# Patient Record
Sex: Female | Born: 1935 | Race: White | Hispanic: No | Smoking: Never smoker
Health system: Southern US, Community
[De-identification: ages and names within clinical notes are randomized; demographics above are authoritative.]

## PROBLEM LIST (undated history)

## (undated) DIAGNOSIS — F0280 Dementia in other diseases classified elsewhere without behavioral disturbance: Secondary | ICD-10-CM

## (undated) DIAGNOSIS — G309 Alzheimer's disease, unspecified: Secondary | ICD-10-CM

## (undated) DIAGNOSIS — F32A Depression, unspecified: Secondary | ICD-10-CM

## (undated) DIAGNOSIS — K219 Gastro-esophageal reflux disease without esophagitis: Secondary | ICD-10-CM

## (undated) DIAGNOSIS — I10 Essential (primary) hypertension: Secondary | ICD-10-CM

## (undated) DIAGNOSIS — Z515 Encounter for palliative care: Secondary | ICD-10-CM

## (undated) DIAGNOSIS — F329 Major depressive disorder, single episode, unspecified: Secondary | ICD-10-CM

## (undated) HISTORY — PX: BACK SURGERY: SHX140

---

## 1997-07-07 ENCOUNTER — Other Ambulatory Visit: Admission: RE | Admit: 1997-07-07 | Discharge: 1997-07-07 | Payer: Self-pay | Admitting: Obstetrics and Gynecology

## 1997-11-08 ENCOUNTER — Other Ambulatory Visit: Admission: RE | Admit: 1997-11-08 | Discharge: 1997-11-08 | Payer: Self-pay | Admitting: Obstetrics and Gynecology

## 1998-03-15 ENCOUNTER — Other Ambulatory Visit: Admission: RE | Admit: 1998-03-15 | Discharge: 1998-03-15 | Payer: Self-pay | Admitting: Obstetrics and Gynecology

## 1998-08-05 ENCOUNTER — Inpatient Hospital Stay (HOSPITAL_COMMUNITY): Admission: EM | Admit: 1998-08-05 | Discharge: 1998-08-11 | Payer: Self-pay | Admitting: Emergency Medicine

## 1998-08-05 ENCOUNTER — Encounter: Payer: Self-pay | Admitting: Gastroenterology

## 1998-08-05 ENCOUNTER — Encounter: Payer: Self-pay | Admitting: Internal Medicine

## 1998-08-06 ENCOUNTER — Encounter: Payer: Self-pay | Admitting: Gastroenterology

## 1998-08-07 ENCOUNTER — Encounter: Payer: Self-pay | Admitting: Gastroenterology

## 1998-08-08 ENCOUNTER — Encounter: Payer: Self-pay | Admitting: Gastroenterology

## 1998-08-09 ENCOUNTER — Encounter: Payer: Self-pay | Admitting: Gastroenterology

## 1998-10-02 ENCOUNTER — Other Ambulatory Visit: Admission: RE | Admit: 1998-10-02 | Discharge: 1998-10-02 | Payer: Self-pay | Admitting: Obstetrics and Gynecology

## 1999-02-26 ENCOUNTER — Other Ambulatory Visit: Admission: RE | Admit: 1999-02-26 | Discharge: 1999-02-26 | Payer: Self-pay | Admitting: Obstetrics and Gynecology

## 1999-08-26 ENCOUNTER — Other Ambulatory Visit: Admission: RE | Admit: 1999-08-26 | Discharge: 1999-08-26 | Payer: Self-pay | Admitting: Obstetrics and Gynecology

## 2000-02-04 ENCOUNTER — Other Ambulatory Visit: Admission: RE | Admit: 2000-02-04 | Discharge: 2000-02-04 | Payer: Self-pay | Admitting: Obstetrics and Gynecology

## 2000-03-06 ENCOUNTER — Encounter (INDEPENDENT_AMBULATORY_CARE_PROVIDER_SITE_OTHER): Payer: Self-pay | Admitting: Specialist

## 2000-03-06 ENCOUNTER — Ambulatory Visit (HOSPITAL_COMMUNITY): Admission: RE | Admit: 2000-03-06 | Discharge: 2000-03-06 | Payer: Self-pay | Admitting: Gastroenterology

## 2000-07-29 ENCOUNTER — Other Ambulatory Visit: Admission: RE | Admit: 2000-07-29 | Discharge: 2000-07-29 | Payer: Self-pay | Admitting: Obstetrics and Gynecology

## 2001-03-29 ENCOUNTER — Other Ambulatory Visit: Admission: RE | Admit: 2001-03-29 | Discharge: 2001-03-29 | Payer: Self-pay | Admitting: Obstetrics and Gynecology

## 2001-11-05 ENCOUNTER — Emergency Department (HOSPITAL_COMMUNITY): Admission: EM | Admit: 2001-11-05 | Discharge: 2001-11-05 | Payer: Self-pay | Admitting: Emergency Medicine

## 2001-11-05 ENCOUNTER — Encounter: Payer: Self-pay | Admitting: Emergency Medicine

## 2002-03-31 ENCOUNTER — Other Ambulatory Visit: Admission: RE | Admit: 2002-03-31 | Discharge: 2002-03-31 | Payer: Self-pay | Admitting: Obstetrics and Gynecology

## 2002-07-13 ENCOUNTER — Encounter: Payer: Self-pay | Admitting: Internal Medicine

## 2002-07-13 ENCOUNTER — Encounter: Admission: RE | Admit: 2002-07-13 | Discharge: 2002-07-13 | Payer: Self-pay | Admitting: Internal Medicine

## 2002-09-29 ENCOUNTER — Other Ambulatory Visit: Admission: RE | Admit: 2002-09-29 | Discharge: 2002-09-29 | Payer: Self-pay | Admitting: Obstetrics and Gynecology

## 2002-10-25 ENCOUNTER — Encounter: Payer: Self-pay | Admitting: Gastroenterology

## 2002-10-25 ENCOUNTER — Encounter: Admission: RE | Admit: 2002-10-25 | Discharge: 2002-10-25 | Payer: Self-pay | Admitting: Gastroenterology

## 2003-06-29 ENCOUNTER — Other Ambulatory Visit: Admission: RE | Admit: 2003-06-29 | Discharge: 2003-06-29 | Payer: Self-pay | Admitting: Obstetrics and Gynecology

## 2004-03-19 ENCOUNTER — Ambulatory Visit (HOSPITAL_COMMUNITY): Admission: RE | Admit: 2004-03-19 | Discharge: 2004-03-19 | Payer: Self-pay | Admitting: Ophthalmology

## 2004-03-29 ENCOUNTER — Ambulatory Visit (HOSPITAL_COMMUNITY): Admission: RE | Admit: 2004-03-29 | Discharge: 2004-03-29 | Payer: Self-pay | Admitting: Ophthalmology

## 2004-11-26 ENCOUNTER — Other Ambulatory Visit: Admission: RE | Admit: 2004-11-26 | Discharge: 2004-11-26 | Payer: Self-pay | Admitting: Obstetrics and Gynecology

## 2005-02-21 ENCOUNTER — Encounter: Admission: RE | Admit: 2005-02-21 | Discharge: 2005-02-21 | Payer: Self-pay | Admitting: Endocrinology

## 2005-06-05 ENCOUNTER — Emergency Department (HOSPITAL_COMMUNITY): Admission: EM | Admit: 2005-06-05 | Discharge: 2005-06-05 | Payer: Self-pay | Admitting: Emergency Medicine

## 2006-03-03 ENCOUNTER — Other Ambulatory Visit: Admission: RE | Admit: 2006-03-03 | Discharge: 2006-03-03 | Payer: Self-pay | Admitting: Obstetrics and Gynecology

## 2006-12-25 ENCOUNTER — Encounter: Admission: RE | Admit: 2006-12-25 | Discharge: 2006-12-25 | Payer: Self-pay | Admitting: Internal Medicine

## 2007-03-11 ENCOUNTER — Other Ambulatory Visit: Admission: RE | Admit: 2007-03-11 | Discharge: 2007-03-11 | Payer: Self-pay | Admitting: Obstetrics and Gynecology

## 2007-03-17 ENCOUNTER — Observation Stay (HOSPITAL_COMMUNITY): Admission: EM | Admit: 2007-03-17 | Discharge: 2007-03-18 | Payer: Self-pay | Admitting: Emergency Medicine

## 2007-04-01 ENCOUNTER — Encounter: Admission: RE | Admit: 2007-04-01 | Discharge: 2007-04-01 | Payer: Self-pay | Admitting: Neurology

## 2008-02-23 ENCOUNTER — Emergency Department (HOSPITAL_COMMUNITY): Admission: EM | Admit: 2008-02-23 | Discharge: 2008-02-23 | Payer: Self-pay | Admitting: Emergency Medicine

## 2008-04-20 ENCOUNTER — Emergency Department: Admission: EM | Admit: 2008-04-20 | Discharge: 2008-04-20 | Payer: Self-pay | Admitting: Emergency Medicine

## 2008-11-14 ENCOUNTER — Emergency Department (HOSPITAL_COMMUNITY): Admission: EM | Admit: 2008-11-14 | Discharge: 2008-11-14 | Payer: Self-pay | Admitting: Emergency Medicine

## 2009-06-11 ENCOUNTER — Emergency Department (HOSPITAL_COMMUNITY): Admission: EM | Admit: 2009-06-11 | Discharge: 2009-06-11 | Payer: Self-pay | Admitting: Emergency Medicine

## 2009-11-06 ENCOUNTER — Emergency Department (HOSPITAL_COMMUNITY): Admission: EM | Admit: 2009-11-06 | Discharge: 2009-11-06 | Payer: Self-pay | Admitting: Emergency Medicine

## 2009-12-30 ENCOUNTER — Emergency Department (HOSPITAL_COMMUNITY): Admission: EM | Admit: 2009-12-30 | Discharge: 2009-12-30 | Payer: Self-pay | Admitting: Emergency Medicine

## 2010-02-05 ENCOUNTER — Encounter: Payer: Self-pay | Admitting: Internal Medicine

## 2010-02-05 ENCOUNTER — Encounter
Admission: RE | Admit: 2010-02-05 | Discharge: 2010-02-05 | Payer: Self-pay | Source: Home / Self Care | Attending: Geriatric Medicine | Admitting: Geriatric Medicine

## 2010-02-11 ENCOUNTER — Ambulatory Visit: Payer: Self-pay | Admitting: Internal Medicine

## 2010-03-12 DIAGNOSIS — F331 Major depressive disorder, recurrent, moderate: Secondary | ICD-10-CM | POA: Insufficient documentation

## 2010-03-12 DIAGNOSIS — F0280 Dementia in other diseases classified elsewhere without behavioral disturbance: Secondary | ICD-10-CM | POA: Insufficient documentation

## 2010-03-12 DIAGNOSIS — I10 Essential (primary) hypertension: Secondary | ICD-10-CM | POA: Insufficient documentation

## 2010-03-12 DIAGNOSIS — K219 Gastro-esophageal reflux disease without esophagitis: Secondary | ICD-10-CM | POA: Insufficient documentation

## 2010-03-12 DIAGNOSIS — G309 Alzheimer's disease, unspecified: Secondary | ICD-10-CM

## 2010-03-12 DIAGNOSIS — M810 Age-related osteoporosis without current pathological fracture: Secondary | ICD-10-CM | POA: Insufficient documentation

## 2010-03-13 ENCOUNTER — Telehealth (INDEPENDENT_AMBULATORY_CARE_PROVIDER_SITE_OTHER): Payer: Self-pay | Admitting: *Deleted

## 2010-03-13 DIAGNOSIS — J4489 Other specified chronic obstructive pulmonary disease: Secondary | ICD-10-CM | POA: Insufficient documentation

## 2010-03-13 DIAGNOSIS — J189 Pneumonia, unspecified organism: Secondary | ICD-10-CM | POA: Insufficient documentation

## 2010-03-13 DIAGNOSIS — J449 Chronic obstructive pulmonary disease, unspecified: Secondary | ICD-10-CM | POA: Insufficient documentation

## 2010-03-14 ENCOUNTER — Encounter: Payer: Self-pay | Admitting: Internal Medicine

## 2010-03-28 NOTE — Miscellaneous (Signed)
Summary: MD Signature Request / Barrett Henle  MD Signature Request / [facility]   Imported By: Lennie Odor 03/22/2010 09:18:36  _____________________________________________________________________  External Attachment:    Type:   Image     Comment:   External Document

## 2010-03-28 NOTE — Assessment & Plan Note (Signed)
Summary: Pulmonary/ new pt eval   Visit Type:  Initial Consult Copy to:  Marletta Lor, NP  Primary Provider/Referring Provider:  Dr. Redmond School  CC:  Abnormal CXR.  History of Present Illness: 45 yowf quit smoking in the 1970s with no resp issues residing in SNF due to dementia.   March 13, 2010  1st pulmonary office eval for cc new onset persistent chest congestion x 2 months s/p 2 courses abx, comes with daughters who feel she has recovered ok and no apparent difficulty with swallowing or sinuses. no noct complaints per family, no need for 02 or nebs.    Pt denies any significant sore throat, dysphagia, itching, sneezing,  nasal congestion or excess secretions,  fever, chills, sweats, unintended wt loss, pleuritic or exertional cp, hempoptysis, change in activity tolerance  orthopnea pnd or leg swelling. Pt also denies any obvious fluctuation in symptoms with weather or environmental change or other alleviating or aggravating factors.       Current Medications (verified): 1)  Aspirin Childrens 81 Mg Chew (Aspirin) .Marland Kitchen.. 1 Once Daily 2)  Diltiazem Hcl Cr 120 Mg Xr24h-Cap (Diltiazem Hcl) .Marland Kitchen.. 1 Once Daily 3)  Donepezil Hcl 10 Mg Tabs (Donepezil Hcl) .Marland Kitchen.. 1 Once Daily 4)  Lexapro 20 Mg Tabs (Escitalopram Oxalate) .Marland Kitchen.. 1 Once Daily 5)  Multivitamins  Tabs (Multiple Vitamin) .Marland Kitchen.. 1 Once Daily 6)  Fish Oil 1000 Mg Caps (Omega-3 Fatty Acids) .Marland Kitchen.. 1 Once Daily 7)  Pantoprazole Sodium 40 Mg Tbec (Pantoprazole Sodium) .Marland Kitchen.. 1 Once Daily 8)  Vitamin D 2000 Unit Tabs (Cholecalciferol) .Marland Kitchen.. 1 Once Daily 9)  Vitamin E 400 Unit Caps (Vitamin E) .Marland Kitchen.. 1 Once Daily 10)  Namenda 10 Mg Tabs (Memantine Hcl) .Marland Kitchen.. 1 Two Times A Day 11)  Enablex 7.5 Mg Xr24h-Tab (Darifenacin Hydrobromide) .Marland Kitchen.. 1 At Bedtime 12)  Acetaminophen 500 Mg Tabs (Acetaminophen) .Marland Kitchen.. 1 Every 6 Hrs As Needed 13)  Baza Clear  Oint (Vitamins A & D) .... As Directed  Allergies (verified): 1)  ! * Ivp Dye  Past History:  Past Medical  History: Dementia  Family History: Asthma- Son Uterine CA- Sister  Social History: Widowed Former smoker.  Quit in 1970's.  Smoked for approx 30 - 40 yrs No ETOH Lives at [facility] facility (memory care)  Vital Signs:  Patient profile:   77 year old female Height:      62 inches Weight:      122 pounds BMI:     22.39 O2 Sat:      93 % on Room air Temp:     97.3 degrees F oral Pulse rate:   76 / minute BP sitting:   138 / 60  (left arm)  Vitals Entered By: Vernie Murders (March 13, 2010 11:40 AM)  O2 Flow:  Room air  Physical Exam  Additional Exam:  Very frail elderly wf, very hard of hearing, not sure why she's here and wants to go home and eat HEENT mild turbinate edema.  Oropharynx no thrush or excess pnd or cobblestoning.  No JVD or cervical adenopathy. Mild accessory muscle hypertrophy. Trachea midline, nl thryroid. Chest was hyperinflated by percussion with diminished breath sounds and moderate increased exp time without wheeze. Hoover sign positive at mid inspiration. Regular rate and rhythm without murmur gallop or rub or increase P2 or edema.  Abd: no hsm, nl excursion. Ext warm without cyanosis or clubbing.     CXR  Procedure date:  02/05/2010  Findings:  mild copd  Impression & Recommendations:  Problem # 1:  COPD UNSPECIFIED (ICD-496) clinical dx only s/p remote smoking cessation therefore main emphasis is on treating symptoms when they are of concern to the patient or fm with 02 if desats or nebs if short of breath or wheezing but no adantage at all to any form of maint rx at this point    DDX of  difficult airways managment all start with A and  include Adherence, Ace Inhibitors, Acid Reflux, Active Sinus Disease, Alpha 1 Antitripsin deficiency, Anxiety masquerading as Airways dz,  ABPA,  allergy(esp in young), Aspiration (esp in elderly), Adverse effects of DPI,  Active smokers, plus one B  = Beta blocker use..  and one C = CHF (check BNP  if resp status worsens)  Aspiration the greatest concern here as is acid reflux.  See instructions for specific recommendations  .  Medications Added to Medication List This Visit: 1)  Aspirin Childrens 81 Mg Chew (Aspirin) .Marland Kitchen.. 1 once daily 2)  Diltiazem Hcl Cr 120 Mg Xr24h-cap (Diltiazem hcl) .Marland Kitchen.. 1 once daily 3)  Donepezil Hcl 10 Mg Tabs (Donepezil hcl) .Marland Kitchen.. 1 once daily 4)  Lexapro 20 Mg Tabs (Escitalopram oxalate) .Marland Kitchen.. 1 once daily 5)  Multivitamins Tabs (Multiple vitamin) .Marland Kitchen.. 1 once daily 6)  Fish Oil 1000 Mg Caps (Omega-3 fatty acids) .Marland Kitchen.. 1 once daily 7)  Pantoprazole Sodium 40 Mg Tbec (Pantoprazole sodium) .Marland Kitchen.. 1 once daily 8)  Vitamin D 2000 Unit Tabs (Cholecalciferol) .Marland Kitchen.. 1 once daily 9)  Vitamin E 400 Unit Caps (Vitamin e) .Marland Kitchen.. 1 once daily 10)  Namenda 10 Mg Tabs (Memantine hcl) .Marland Kitchen.. 1 two times a day 11)  Enablex 7.5 Mg Xr24h-tab (Darifenacin hydrobromide) .Marland Kitchen.. 1 at bedtime 12)  Acetaminophen 500 Mg Tabs (Acetaminophen) .Marland Kitchen.. 1 every 6 hrs as needed 13)  Baza Clear Oint (Vitamins a & d) .... As directed  Other Orders: New Patient Level V [phone])  Patient Instructions: 1)  Stop fish oil as there is a risk it may get into the lungs 2)  if any problem observed while swallowing will need a formal swallowing evaluation 3)  If wheeze/ cough or short of breath can use duoneb every 6 hours as needed but no need to treat unless the symptoms bother her alot more than they do now

## 2010-03-28 NOTE — Progress Notes (Signed)
Summary: orders/ meds - LMTCB x1  Phone Note Call from Patient   Caller: noah w/ [facility] Call For: wert Summary of Call: caller is faxing paper re: pt instructions: pt was seen this am by dr wert. caller says that this needs dr wert's signature. also: needs an order for duoneb "to present to pharmacy" so this will be "on hand" for pt if needed. noah # [id number]. fax # is [phone] Initial call taken by: Tivis Ringer, CNA,  March 13, 2010 1:12 PM  Follow-up for Phone Call        no faxed received as of yet.  LMOM TCB for Anette Riedel to verify what fax # he sent the orders to. Boone Master CNA/MA  March 13, 2010 3:02 PM   Kara Mead from [facility] checking on status of papers that were faxed yesteday, she can be reached at [phone] ext [phone].Darletta Moll  March 14, 2010 12:40 PM   Additional Follow-up for Phone Call Additional follow up Details #1::        Form was signed by MW and faxed back to the number provided on their fax cover sheet. Additional Follow-up by: Vernie Murders,  March 14, 2010 1:48 PM

## 2010-05-09 LAB — URINALYSIS, ROUTINE W REFLEX MICROSCOPIC
Bilirubin Urine: NEGATIVE
Ketones, ur: NEGATIVE mg/dL
Nitrite: NEGATIVE
Specific Gravity, Urine: 1.023 (ref 1.005–1.030)
Urobilinogen, UA: 0.2 mg/dL (ref 0.0–1.0)
pH: 6 (ref 5.0–8.0)

## 2010-05-09 LAB — POCT I-STAT, CHEM 8
BUN: 16 mg/dL (ref 6–23)
Chloride: 106 mEq/L (ref 96–112)
Creatinine, Ser: 0.8 mg/dL (ref 0.4–1.2)
Sodium: 145 mEq/L (ref 135–145)

## 2010-05-09 LAB — CBC
HCT: 36.2 % (ref 36.0–46.0)
MCHC: 33.6 g/dL (ref 30.0–36.0)
Platelets: 226 10*3/uL (ref 150–400)
RDW: 13.3 % (ref 11.5–15.5)
WBC: 7.2 10*3/uL (ref 4.0–10.5)

## 2010-05-09 LAB — DIFFERENTIAL
Basophils Absolute: 0.1 10*3/uL (ref 0.0–0.1)
Basophils Relative: 1 % (ref 0–1)
Eosinophils Relative: 2 % (ref 0–5)
Lymphocytes Relative: 36 % (ref 12–46)
Monocytes Absolute: 0.5 10*3/uL (ref 0.1–1.0)
Neutro Abs: 4 10*3/uL (ref 1.7–7.7)

## 2010-05-09 LAB — URINE CULTURE

## 2010-05-14 LAB — DIFFERENTIAL
Basophils Absolute: 0.1 10*3/uL (ref 0.0–0.1)
Eosinophils Absolute: 0.1 10*3/uL (ref 0.0–0.7)
Eosinophils Relative: 1 % (ref 0–5)
Lymphocytes Relative: 24 % (ref 12–46)
Neutrophils Relative %: 69 % (ref 43–77)

## 2010-05-14 LAB — CBC
HCT: 37.4 % (ref 36.0–46.0)
Platelets: 240 10*3/uL (ref 150–400)
RDW: 13.9 % (ref 11.5–15.5)

## 2010-05-14 LAB — BASIC METABOLIC PANEL
BUN: 9 mg/dL (ref 6–23)
Creatinine, Ser: 0.61 mg/dL (ref 0.4–1.2)
GFR calc non Af Amer: >60 mL/min (ref >60)
Glucose, Bld: 113 mg/dL — ABNORMAL HIGH (ref 70–99)
Potassium: 3.9 mEq/L (ref 3.5–5.1)

## 2010-05-14 LAB — URINE MICROSCOPIC-ADD ON

## 2010-05-14 LAB — URINALYSIS, ROUTINE W REFLEX MICROSCOPIC
Glucose, UA: NEGATIVE mg/dL
Protein, ur: 30 mg/dL — AB
Specific Gravity, Urine: 1.014 (ref 1.005–1.030)
pH: 6.5 (ref 5.0–8.0)

## 2010-06-11 LAB — DIFFERENTIAL
Basophils Absolute: 0 10*3/uL (ref 0.0–0.1)
Basophils Relative: 1 % (ref 0–1)
Eosinophils Relative: 1 % (ref 0–5)
Monocytes Absolute: 0.7 10*3/uL (ref 0.1–1.0)

## 2010-06-11 LAB — URINALYSIS, ROUTINE W REFLEX MICROSCOPIC
Ketones, ur: NEGATIVE mg/dL
Leukocytes, UA: NEGATIVE
Nitrite: NEGATIVE
Protein, ur: 30 mg/dL — AB
pH: 7 (ref 5.0–8.0)

## 2010-06-11 LAB — COMPREHENSIVE METABOLIC PANEL
AST: 26 U/L (ref 0–37)
BUN: 8 mg/dL (ref 6–23)
CO2: 26 mEq/L (ref 19–32)
Chloride: 105 mEq/L (ref 96–112)
Creatinine, Ser: 0.48 mg/dL (ref 0.4–1.2)
GFR calc non Af Amer: >60 mL/min (ref >60)
Total Bilirubin: 0.6 mg/dL (ref 0.3–1.2)

## 2010-06-11 LAB — CBC
HCT: 38.5 % (ref 36.0–46.0)
Hemoglobin: 13 g/dL (ref 12.0–15.0)
MCHC: 33.8 g/dL (ref 30.0–36.0)
RDW: 13.3 % (ref 11.5–15.5)

## 2010-07-09 NOTE — Consult Note (Signed)
NAMEETHYLE, TIEDT                 ACCOUNT NO.:  [id number]   MEDICAL RECORD NO.:  [id number]          PATIENT TYPE:  OBV   LOCATION:  3308                         FACILITY:  [facility]   PHYSICIAN:  Natasha Cline, M.D.DATE OF BIRTH:  [birth year]   DATE OF CONSULTATION:  03/18/2007  DATE OF DISCHARGE:                                 CONSULTATION   CHIEF COMPLAINT:  Subdural hematomas secondary to falling.   I was asked by Dr. Jacky Cline to see Natasha Cline, a patient well-known to  my partner, Dr. Avie Cline.  [name] apparently has Alzheimer's  disease and is on the combination of Namenda and Aricept.  I have spoken  with Dr. Othelia Cline.  Apparently there is also an issue with alcohol  that may have added to this problem.   The patient is in assisted living at [facility] with 8-hour-a-day care.  She apparently fell at assisted living and had altered mental status.  CT scan of the brain showed 2 small subdural hematomas in the sylvian  fissure and in the frontal region on the left without brain contusion.   The patient was awake and alert, was able to follow commands.  Plans  were to follow her conservatively in the hospital.  This has been done,  and she was ready for discharge today.   Family was concerned about her falls, her altered mental status and  requested that Dr. Jacky Cline see her.  He has evaluated her and has looked  for treatable causes that could cause altered mental status and has not  found any evidence of metabolic dysfunction or of infections  that could  have caused a delirium superimposed upon her dementia.   PAST MEDICAL HISTORY:  Remarkable for hypertension, gastroesophageal  reflux disease, osteoporosis and sundowning.  The latter is part of her  dementia.   The patient was found with an ecchymosis on the back of her head.  It  appears that the lesions are what would be considered a contrecoup  injury.   The patient has been seizure-free in the  hospital.  She has not had  fever.  She had some nausea and vomiting, which has subsided.  She has  no complaints at this time.   PAST SURGICAL HISTORY:  Iridectomy.  She had a repeat procedure because  of retained lens material.   SOCIAL HISTORY:  The patient lives at [facility].  She does not use  tobacco or alcohol.  She requires supervision to live.  She is able to  feed herself.  She is incontinent of urine.  She requires assistance in  other activities of daily living such as dressing.   CURRENT MEDICATIONS:  Aricept 10 mg in the evening, Cardizem 240 mg CD  once daily, Lexapro 20 mg daily, Namenda 10 mg daily, Protonix 40 mg in  the evening, aspirin 81 mg daily, Evista 60 mg once a day.  Calcium  carbonate, fish oil and multivitamins were stopped in the hospital as  was the aspirin but will be restarted when she returns to the nursing  home.   DRUG  ALLERGIES:  SHE HAD AN ALLERGIC REACTION TO IVP DYE AND IODINE-  CONTAINING DYES.   LABORATORY DATA:  Magnesium 1.7.  Sodium 143, potassium 3.6, chloride  107, CO2 27, glucose 105, BUN 7, creatinine 0.7, bilirubin 0.6, alkaline  phosphatase 47, AST 23, ALT 12, total protein 5.9, albumin 3.2, calcium  8.8.  The only abnormality is a protein slightly low as is the albumin,  and glucose minimally high.  CBC with diff:  Hemoglobin 12.6, hematocrit  37, MCV 97.2, white blood cell count 8100, platelet count 240,000.  PTT  29, prothrombin time 12.9 with an INR of 1.0, troponin I of 0.03.  Urinalysis:  3 to 6 white blood cells, 0 to 2 red blood cells, many  bacteria, negative nitrite, small leukocyte esterase.  This makes  urinary tract infection highly unlikely.   PHYSICAL EXAMINATION:  GENERAL:  On examination today, this is a well-  developed, well-nourished woman in no acute distress.  VITAL SIGNS:  Temperature 97.2, blood pressure 149/52, resting pulse 73,  respirations 21, oxygen saturation 96%.  HEAD, EYES, EARS, NOSE AND THROAT:   No signs of infection.  NECK:  Supple neck, full range of motion.  She has a cervical bruit on the  right.  LUNGS:  Clear.  HEART:  Shows a systolic murmur at the base.  Pulses are normal.  ABDOMEN:  Soft, nontender.  Bowel sounds normal.  No hepatosplenomegaly.  EXTREMITIES:  Show some bruising but no edema or cyanosis.  They are  warm and pink.  NEUROLOGIC:  Mental status:  The patient can tell me her name.  She does  not know where she lives.  She does not know where she is at this time.  She cannot tell me the year or the date or the President.  She can name  objects and follow simple commands.  Cranial nerves:  Round pupils with  a left iridectomy, the right appears to still have a cataract.  Fundi  are difficult to see.  Visual fields are full to counting fingers.  Extraocular movements are full and conjugate.  Symmetric facial  strength.  Midline tongue.  She turns to localize sound.  Motor  examination:  The patient had difficulty cooperating.  She has definite  apraxia.  She seems to have fairly normal strength in her arms and legs,  although there may be some proximal weakness.  It is very difficult to  test because of cooperation.  She can wiggle her fingers and her toes.  She can appose her thumb and her fingers.  She does not show pronator  drift.  Sensation appears to be equal in her arms and legs to noxious  stimuli.  She also had good stereoagnosis bilaterally.  Cerebellar  examination:  Clumsy rapid repetitive movements, some difficulty with  finger-to-nose without true dysmetria.  Gait is  significantly broad-  based.  She really cannot walk without assistance.  She bears weight on  her legs but would fall if I let her go.  Deep tendon reflexes are  symmetric and diminished.  The patient had bilateral flexor plantar  responses.   IMPRESSION:  1. Subdural hematomas over the left frontal and left sylvian region.      (432.1)  These are small and do not require  surgical intervention.      They will not likely grow.  They could serve as a source for      seizures in the future.  2. Organic gait disorder, severe,  781.2.  3. Alzheimer's disease, moderate, 331.0.  4. Urinary incontinence.   PLAN:  The patient needs to have 24/7 care at [facility] in the assisted  living area.   I recommend continuing Aricept and Namenda.  Follow up with Dr. Sandria Manly at  the next regularly scheduled appointment.  This been discussed with Dr.  Jacky Cline and daughter Natasha Cline.  I do not believe there is any reason to keep  the patient in the hospital, but I do think that she needs to have close  supervision 24 hours a day because she  does not have the insight to know that she cannot walk, and though she  can bear weight on her legs she is not steady.  I also do not think that  with her dementia that she has the ability to learn how to walk.   I appreciate the opportunity to participate in her care.  I reviewed her  laboratories, her chest x-ray, her CT scans.      Natasha Artis. Sharene Skeans, M.D.  Electronically Signed     WH[facility]/MEDQ  D:  03/18/2007  T:  03/18/2007  Job:  [id number]   cc:   Reinaldo Meeker, M.D.  Geoffry Paradise, M.D.

## 2010-07-12 NOTE — Procedures (Signed)
[place]  Patient:    Natasha Cline, Natasha Cline                        MRN: [id number] Proc. Date: 03/06/00 Adm. Date:  [id number] Attending:  Rich Brave CC:         Titus Dubin. Alwyn Ren, M.D. [facility]   Procedure Report  PROCEDURE:  Colonoscopy (partial) with biopsies.  INDICATION:  An 77 year old female with probable ischemic colitis.  She had the abrupt onset of low abdominal pain, which awoke her out of sleep approximately 36 hours ago, which was followed by diarrhea which became bloody.  Her exam in the office today was benign except for the presence of red blood on rectal examination.  She felt slightly dizzy but was not frankly orthostatic by symptoms.  Labs are available at this time and show a white count of 15,700, hemoglobin 13.8, BUN 19 with creatinine of 1.1.  FINDINGS:  Segmental colitis in the sigmoid region consistent with ischemic colitis.  DESCRIPTION OF PROCEDURE:  The nature, purpose, and risks of the procedure have been reviewed with the patient, who provided written consent.  Sedation was fentanyl 25 mcg and Versed 3 mg IV without arrhythmias or desaturation. We intentionally used a small amount of medication because the patient has a prior history of having developed severe colonic distention and pseudo-obstruction type symptoms following a previous colonoscopy by another physician, perhaps in part related to a large amount of sedative medication required.  The Olympus pediatric adjustable-tension video colonoscope was advanced to the distal transverse colon, and pullback was then initiated.  I did not attempt to go further so as to minimize patient discomfort and the need for sedative medications, and because it was evident that the diagnostic intent of the exam had already been achieved.  The patient had segmental colitis from 20-42 cm, with normal colonic mucosa in the rectum and distal sigmoid and normal mucosa above  the level of this exam in the region of the splenic flexure and the distal transverse colon.  The inflamed segment was characterized by complete loss of vascularity, erythema, mucosal edema, and exudate, but it did not appear frankly gangrenous.  It did certainly have the appearance of ischemic colitis.  To further verify this diagnostic impression, several biopsies were obtained.  There appeared to be a small polyp at about 22 cm, which I biopsied.  The patient is known to have had polyps on past colonoscopy exams.  The patient tolerated this procedure well, and there were no apparent complications.  IMPRESSION:  Probable ischemic colitis of the distal descending and sigmoid colon.  Possible small residual polyp.  PLAN:  Await pathology on biopsies.  Outpatient management is satisfactory in view of the relatively moderate severity of the colitis and the fact that the patient is not clinically compromised or toxic.  She has been instructed in a low-residue diet, has Vicodin pills for relief of pain and spasms, and knows to call us in the event of worsening of symptoms. DD:  03/06/00 TD:  03/07/00 Job: [id number]

## 2010-07-12 NOTE — Op Note (Signed)
[name], Natasha Cline                 ACCOUNT NO.:  [id number]   MEDICAL RECORD NO.:  [id number]          PATIENT TYPE:  OIB   LOCATION:  2550                         FACILITY:  [facility]   PHYSICIAN:  Guadelupe Sabin, M.D.DATE OF BIRTH:  [birth year]   DATE OF PROCEDURE:  03/29/2004  DATE OF DISCHARGE:  03/29/2004                                 OPERATIVE REPORT   PREOPERATIVE DIAGNOSIS:  Retained lens fragment, left eye, following  cataract surgery.   POSTOPERATIVE DIAGNOSIS:  Retained lens fragment, left eye, following  cataract surgery.   DATE OF OPERATION:  Removal of retained lens fragment, left eye.   SURGEON:  Guadelupe Sabin, M.D.   ASSISTANT:  Nurse.   ANESTHESIA:  Local 4% Xylocaine, 0.75 Marcaine retrobulbar block with Wydase  added, topical tetracaine.   OPERATIVE PROCEDURE:  After the patient was prepped and draped, a lid  speculum was inserted in the left eye.  The ocular microscope was aligned.  Inspection of the eye revealed the Local 4% Xylocaine, 0.75 Marcaine  retrobulbar block with Wydase added, topical tetracaine.  Inspection of the  eye revealed the 2 x 2 brunescent nuclear cataract fragment on the  intraocular lens implant surface.  The implant itself appeared to be  dislocated downwards slightly downward.  Two incisions were made at the  corneal limbus with the 20-gauge MVR blade.  The Lewicky anterior chamber  maintainer infusion terminal was secured in place at the 4 o'clock position.  At the 11 o'clock position, the handpiece of the vitreous infusion suction  cutter was then inserted.  A third incision was made at the 2 o'clock  position with the MVR capital blade.  The Kuglen hook was inserted  temporarily in the third incision.  With the infusion terminal open and the  vitreous infusion suction cutter handpiece aspirating,  the nuclear  brunescent cataract was aspirated without difficulty.  Attention was then  paid slightly to  the slightly displaced  intraocular lens implant.  An  attempt was made to rotate the haptics of the implant.  It was noted, however, that there appeared to be a peripheral break in the  posterior capsule,  and it was felt that further manipulation might further  dislocate the implant.  It was then elected to close leaving the implant in  this relatively good position with only slight decentration downward.  The  instruments were withdrawn and the incisions appeared to be self-sealing.  Depo-Garamycin and dexamethasone were injected in the sub-Tenon's space  inferiorly and Maxitrol ointment instilled in the conjunctival cul-de-sac.  Duration of procedure 30-45 minutes.  The patient tolerated the procedure  well in general, left the operating room for the recovery room in good  condition.      HNJ/MEDQ  D:  04/28/2004  T:  04/29/2004  Job:  [id number]

## 2010-07-12 NOTE — H&P (Signed)
Natasha Cline, Natasha Cline NO.:  [id number]   MEDICAL RECORD NO.:  [id number]          PATIENT TYPE:  OIB   LOCATION:  2899                         FACILITY:  [facility]   PHYSICIAN:  Guadelupe Sabin, M.D.DATE OF BIRTH:  [birth year]   DATE OF ADMISSION:  03/19/2004  DATE OF DISCHARGE:                                HISTORY & PHYSICAL   This was a planned outpatient surgical admission of this 77 year old white  female admitted for cataract implant surgery of the left eye.   PRESENT ILLNESS:  This patient has been followed in my office for routine  eye care since Jun 25, 1970. At that time her vision was corrected to 20/20  in each eye and had an essentially normal. Ocular exam over the ensuing  years glasses changes have occurred; and the patient has was has been noted  to have increased intraocular pressure in 1995.  Later, it was felt that  this represented an early, borderline, open-angle glaucoma; and the patient  was followed but not begun on treatment.  Gradual cataract formation has  ensued; and now vision has deteriorated to 20/40 right eye 20/200 left eye  with best correction.  The patient has elected to proceed with cataract  implant surgery of the left eye now; right eye later as needed. The patient  was given oral discussion and printed information concerning the procedure  and its possible complications. She signed an informed consent and  arrangements were made for her outpatient admission at this time.   PAST MEDICAL HISTORY:  The patient is in stable general health under the  care of Dr. Jacky Kindle   The  patient is felt to be in satisfactory condition for the proposed  surgery.   ALLERGIES:  The patient is said to be allergic to IODINE, SHELLFISH.   CURRENT MEDICATIONS:  Cardizem, Evista, Lexapro, Protonix, Namenda.   REVIEW OF SYSTEMS:  No cardiorespiratory complaints.   PHYSICAL EXAMINATION:  VITAL SIGNS:  As recorded on admission blood pressure  162/82, pulse 86, respirations 18, temperature 97.8. GENERAL APPEARANCE:  The patient is a petite 77 year old, thin, white female in no acute  distress.  HEENT:  Eyes-visual acuity as noted above. Slit lamp examination bilateral  nuclear cataract formation. Dilated detailed fundus examination shows a  clear vitreous attached retina, and the optic nerve is sharply outlined, of  good color, disk-to-cup ration 0.3.  Blood vessels and macula appear normal.  CHEST:  Lungs clear to percussion and auscultation.  HEART:  Normal sinus rhythm, no cardiomegaly. No murmurs.  ABDOMEN:  Negative.  EXTREMITIES:  Negative.   ADMISSION DIAGNOSIS:  Senile cataract both eyes.   SURGICAL PLAN:  Cataract implant surgery left eye, now; right eye later.      HNJ/MEDQ  D:  03/19/2004  T:  03/19/2004  Job:  [id number]   cc:   Geoffry Paradise, M.D.  [address]  [place]  [place] [address]  Fax: [phone]

## 2010-07-12 NOTE — Op Note (Signed)
NAMEBRADYN, Natasha Cline                 ACCOUNT NO.:  [id number]   MEDICAL RECORD NO.:  [id number]          PATIENT TYPE:  OIB   LOCATION:  2899                         FACILITY:  [facility]   PHYSICIAN:  Guadelupe Sabin, M.D.DATE OF BIRTH:  1915/05/01   DATE OF PROCEDURE:  03/19/2004  DATE OF DISCHARGE:                                 OPERATIVE REPORT   PREOPERATIVE DIAGNOSIS:  Senile nuclear cataract,left eye.   POSTOPERATIVE DIAGNOSIS:  Senile nuclear cataract,left eye.   NAME OF OPERATION:  Planned extracapsular cataract extraction -  phacoemulsification, primary insertion of posterior chamber intraocular lens  implant.   SURGEON:  Dr. Cecilie Kicks.   ASSISTANT:  Nurse.   ANESTHESIA:  Local 4% Xylocaine, 0.75 Marcaine, retrobulbar block with  Wydase added, topical tetracaine, intraocular Xylocaine, anesthesia standby  required in this 77 year old female.   OPERATIVE PROCEDURE:  After the patient was prepped and draped, a lid  speculum was inserted in the left eye. Schiotz tonometry was recorded at  five scale units with a 5.5 gram weight. A superior rectus traction suture  was placed. A peritomy was begun from the 11 to 1 o'clock position adjacent  to the limbus. The corneoscleral junction was cleaned and a corneoscleral  groove made with a 45-degree super blade. The anterior chamber was then  entered with a 2.5-mm diamond keratome at the 12 o'clock position and a 15-  degree blade at the 2:30 position. Using a bent 26-gauge needle on Occucoat  syringe, a circular capsulorrhexis was begun and then completed with the  Graybow forceps. Hydrodissection and hydrodelineation were performed using  1% Xylocaine. The 30-degree phacoemulsification tip was then inserted with  slow controlled emulsification of the lens nucleus with back cracking and  fragmentation with the Bechert pick. Total ultrasonic time 1 minute 3  seconds, average power level 17%, total amount of fluid used 50 cc.  Following removal of the nucleus, the residual cortex was aspirated with the  silicone tipped irrigation-aspiration probe. The posterior capsule appeared  intact. It was therefore elected to insert an [employer]er[employer] medical optics SI40MB  silicone three-piece posterior chamber intraocular lens implant, diopter  strength +23.00. This was inserted with the McDonald forceps into the  anterior chamber and then centered into the capsular bag using the [facility]  lens rotator. The lens appeared to be well-centered. The Healon and Provisc  which had been used intermittently during the procedure were aspirated and  replaced with balanced salt solution and Miochol ophthalmic solution. There  was slight iris bleeding from pulling the haptics across the iris for  insertion into the capsular bag. This appeared to be minimal. The operative  incision was felt to be self-sealing. It was, however, elected to place a 10-  0 interrupted nylon suture across the incision to ensure closure and to  prevent endophthalmitis.  Maxitrol ointment was instilled in the conjunctival cul-de-sac. A light  patch protector shield were applied. Duration of procedure and anesthesia  administration 45 minutes. The patient tolerated the procedure well in  general, left the operating room to the recovery room in good condition.  HNJ/MEDQ  D:  03/19/2004  T:  03/19/2004  Job:  [id number]

## 2010-07-12 NOTE — H&P (Signed)
Natasha Cline, Natasha Cline                 ACCOUNT NO.:  [id number]   MEDICAL RECORD NO.:  [id number]          PATIENT TYPE:  OIB   LOCATION:  2550                         FACILITY:  [facility]   PHYSICIAN:  Guadelupe Sabin, M.D.DATE OF BIRTH:  07-29-1915   DATE OF ADMISSION:  03/29/2004  DATE OF DISCHARGE:  03/29/2004                                HISTORY & PHYSICAL   This was a planned outpatient readmission of this 77 year old white female  admitted for retained lens fragments following cataract surgery March 19, 2004.   This patient had uncomplicated cataract implant surgery performed on March 19, 2004, at this hospital.  At the time of surgery, it was felt that the  surgery went well with no posterior capsule rupture. At surgery, it was  noted that the lens implant had some slight difficulty with centration but  appeared to be in good  pupillary position. Postoperatively, the patient did  well. It was noted, however, that the lens was slightly decentered downward  and that a small 2 x 2 nuclear lens fragments was present in the inferior  angle.  It was felt that this would might cause inflammation. The patient  had a transiently elevated intraocular pressure during the first 24-48 hours  following the original surgery but then was controlled on topical  medications including Vigamox, Maxidex, Acular, and Azopt.  The patient  improved slowly, but the patient's pressure was still elevated. It was  elected, therefore, to proceed with removal of the cataract fragment hoping  this would provide decreased inflammation and improved vision. The patient  signed an informed consent, and arrangements were made for her outpatient  admission at this time.   PAST MEDICAL HISTORY:  See old chart. The patient is in stable general  health.   REVIEW OF SYSTEMS:  No cardiorespiratory complaints.   PHYSICAL EXAMINATION:  VITAL SIGNS: As recorded on admission.  GENERAL APPEARANCE: The patient is a  pleasant well-nourished, well-developed  white female in no acute distress.  HEENT: Eyes: Visual acuity 20/60+ right eye, 20/50 +2 left eye. Applanation  tonometry:  22 mm right eye, 28 left eye. Slit lamp examination:  The eyes  are white and clear.  The cornea of the left eye is clear. The anterior  chamber is deep with a small 2 x 2 mm fragment of lens nucleus in the  inferior angle.  The posterior chamber implant appears to be slightly  decentered downward.  There does not appear to be any posterior capsule  break.  Fundus examination shows no dislocated lens fragments in the  vitreous cavity.  The retina is attached. The optic nerve, blood vessels,  and macula appear normal.  CHEST:  Lungs clear to percussion and auscultation.  HEART: Normal sinus rhythm, no cardiomegaly. No murmurs.  ABDOMEN: Negative.  EXTREMITIES: Negative.   READMISSION DIAGNOSIS:  Retained lens fragments following cataract implant  surgery.   SURGICAL PLAN:  Removal of retained lens fragment.      HNJ/MEDQ  D:  04/28/2004  T:  04/28/2004  Job:  [id number]

## 2010-11-15 LAB — I-STAT 8, (EC8 V) (CONVERTED LAB)
BUN: 21
Glucose, Bld: 118 — ABNORMAL HIGH
Potassium: 3.6
TCO2: 29
pH, Ven: 7.341 — ABNORMAL HIGH

## 2010-11-15 LAB — CBC
HCT: 37
HCT: 37
Hemoglobin: 12.5
Hemoglobin: 12.6
MCHC: 33.7
MCHC: 34
MCV: 97
MCV: 97.2
Platelets: 240
Platelets: 272
RBC: 3.82 — ABNORMAL LOW
RDW: 13.2
RDW: 13.4
WBC: 10.6 — ABNORMAL HIGH

## 2010-11-15 LAB — URINALYSIS, ROUTINE W REFLEX MICROSCOPIC
Glucose, UA: NEGATIVE
Hgb urine dipstick: NEGATIVE
Ketones, ur: 15 — AB
Nitrite: NEGATIVE
Protein, ur: 30 — AB
Specific Gravity, Urine: 1.029
Urobilinogen, UA: 1
pH: 5.5

## 2010-11-15 LAB — COMPREHENSIVE METABOLIC PANEL
Albumin: 3.2 — ABNORMAL LOW
BUN: 7
Creatinine, Ser: 0.7
Glucose, Bld: 105 — ABNORMAL HIGH
Total Protein: 5.9 — ABNORMAL LOW

## 2010-11-15 LAB — TSH: TSH: 1.544

## 2010-11-15 LAB — POCT I-STAT CREATININE
Creatinine, Ser: 0.9
Operator id: 198171

## 2010-11-15 LAB — DIFFERENTIAL
Basophils Absolute: 0.1
Basophils Relative: 1
Eosinophils Absolute: 0.1
Eosinophils Relative: 1
Lymphocytes Relative: 25
Lymphs Abs: 2.6
Monocytes Absolute: 1
Monocytes Relative: 10
Neutro Abs: 6.8
Neutrophils Relative %: 64

## 2010-11-15 LAB — URINE MICROSCOPIC-ADD ON

## 2010-11-15 LAB — PROTIME-INR
INR: 1
Prothrombin Time: 12.9

## 2010-11-15 LAB — TROPONIN I: Troponin I: 0.03

## 2010-11-15 LAB — VITAMIN B12: Vitamin B-12: 907 (ref 211–911)

## 2010-11-15 LAB — MAGNESIUM: Magnesium: 1.7

## 2010-11-15 LAB — APTT: aPTT: 29

## 2010-11-29 LAB — POCT CARDIAC MARKERS
CKMB, poc: 1.8 ng/mL (ref 1.0–8.0)
CKMB, poc: 2.1 ng/mL (ref 1.0–8.0)
Myoglobin, poc: 110 ng/mL (ref 12–200)
Myoglobin, poc: 115 ng/mL (ref 12–200)
Troponin i, poc: <0.05 ng/mL (ref 0.00–0.09)

## 2010-11-29 LAB — DIFFERENTIAL
Basophils Absolute: 0.1 10*3/uL (ref 0.0–0.1)
Basophils Relative: 1 % (ref 0–1)
Lymphocytes Relative: 21 % (ref 12–46)
Neutro Abs: 6.5 10*3/uL (ref 1.7–7.7)
Neutrophils Relative %: 70 % (ref 43–77)

## 2010-11-29 LAB — URINALYSIS, ROUTINE W REFLEX MICROSCOPIC
Bilirubin Urine: NEGATIVE
Hgb urine dipstick: NEGATIVE
Nitrite: NEGATIVE
Protein, ur: 30 mg/dL — AB
Specific Gravity, Urine: 1.024 (ref 1.005–1.030)
Urobilinogen, UA: 1 mg/dL (ref 0.0–1.0)

## 2010-11-29 LAB — GLUCOSE, CAPILLARY: Glucose-Capillary: 99 mg/dL (ref 70–99)

## 2010-11-29 LAB — HEPATIC FUNCTION PANEL
ALT: 11 U/L (ref 0–35)
AST: 23 U/L (ref 0–37)
Albumin: 3.7 g/dL (ref 3.5–5.2)
Total Protein: 6.5 g/dL (ref 6.0–8.3)

## 2010-11-29 LAB — BASIC METABOLIC PANEL
CO2: 27 mEq/L (ref 19–32)
Calcium: 9.5 mg/dL (ref 8.4–10.5)
Creatinine, Ser: 0.54 mg/dL (ref 0.4–1.2)
GFR calc Af Amer: >60 mL/min (ref >60)
GFR calc non Af Amer: >60 mL/min (ref >60)
Glucose, Bld: 92 mg/dL (ref 70–99)
Sodium: 143 mEq/L (ref 135–145)

## 2010-11-29 LAB — CBC
Hemoglobin: 13.3 g/dL (ref 12.0–15.0)
MCHC: 33 g/dL (ref 30.0–36.0)
RDW: 13.4 % (ref 11.5–15.5)

## 2011-01-17 ENCOUNTER — Emergency Department (HOSPITAL_COMMUNITY)
Admission: EM | Admit: 2011-01-17 | Discharge: 2011-01-18 | Disposition: A | Discharge status: Home Health | Payer: Medicare Other | Attending: Emergency Medicine | Admitting: Emergency Medicine

## 2011-01-17 ENCOUNTER — Emergency Department (HOSPITAL_COMMUNITY): Payer: Medicare Other

## 2011-01-17 ENCOUNTER — Encounter: Payer: Self-pay | Admitting: *Deleted

## 2011-01-17 DIAGNOSIS — R071 Chest pain on breathing: Secondary | ICD-10-CM | POA: Insufficient documentation

## 2011-01-17 DIAGNOSIS — M810 Age-related osteoporosis without current pathological fracture: Secondary | ICD-10-CM | POA: Insufficient documentation

## 2011-01-17 DIAGNOSIS — G309 Alzheimer's disease, unspecified: Secondary | ICD-10-CM | POA: Insufficient documentation

## 2011-01-17 DIAGNOSIS — S0121XA Laceration without foreign body of nose, initial encounter: Secondary | ICD-10-CM

## 2011-01-17 DIAGNOSIS — W010XXA Fall on same level from slipping, tripping and stumbling without subsequent striking against object, initial encounter: Secondary | ICD-10-CM | POA: Insufficient documentation

## 2011-01-17 DIAGNOSIS — W19XXXA Unspecified fall, initial encounter: Secondary | ICD-10-CM

## 2011-01-17 DIAGNOSIS — F3289 Other specified depressive episodes: Secondary | ICD-10-CM | POA: Insufficient documentation

## 2011-01-17 DIAGNOSIS — S01502A Unspecified open wound of oral cavity, initial encounter: Secondary | ICD-10-CM | POA: Insufficient documentation

## 2011-01-17 DIAGNOSIS — F0280 Dementia in other diseases classified elsewhere without behavioral disturbance: Secondary | ICD-10-CM | POA: Insufficient documentation

## 2011-01-17 DIAGNOSIS — K219 Gastro-esophageal reflux disease without esophagitis: Secondary | ICD-10-CM | POA: Insufficient documentation

## 2011-01-17 DIAGNOSIS — Y9210 Unspecified residential institution as the place of occurrence of the external cause: Secondary | ICD-10-CM | POA: Insufficient documentation

## 2011-01-17 DIAGNOSIS — F329 Major depressive disorder, single episode, unspecified: Secondary | ICD-10-CM | POA: Insufficient documentation

## 2011-01-17 DIAGNOSIS — S0120XA Unspecified open wound of nose, initial encounter: Secondary | ICD-10-CM | POA: Insufficient documentation

## 2011-01-17 DIAGNOSIS — I10 Essential (primary) hypertension: Secondary | ICD-10-CM | POA: Insufficient documentation

## 2011-01-17 HISTORY — DX: Alzheimer's disease, unspecified: G30.9

## 2011-01-17 HISTORY — DX: Gastro-esophageal reflux disease without esophagitis: K21.9

## 2011-01-17 HISTORY — DX: Dementia in other diseases classified elsewhere, unspecified severity, without behavioral disturbance, psychotic disturbance, mood disturbance, and anxiety: F02.80

## 2011-01-17 HISTORY — DX: Major depressive disorder, single episode, unspecified: F32.9

## 2011-01-17 HISTORY — DX: Essential (primary) hypertension: I10

## 2011-01-17 HISTORY — DX: Depression, unspecified: F32.A

## 2011-01-17 NOTE — ED Notes (Signed)
Per family, tooth has been missing for some time.

## 2011-01-17 NOTE — ED Provider Notes (Signed)
History     CSN: [id number] Arrival date & time: 01/17/2011 10:05 PM   First MD Initiated Contact with Patient 01/17/11 2223      Chief Complaint  Patient presents with  . Fall    (Consider location/radiation/quality/duration/timing/severity/associated sxs/prior treatment) Patient is a 77 y.o. female presenting with fall. The history is provided by the patient, the nursing home and a relative. The history is limited by the condition of the patient.  Fall The accident occurred 1 to 2 hours ago (presumed; unknown time of fall). The fall occurred while walking. Distance fallen: standing. She landed on a hard floor. The volume of blood lost was minimal. Point of impact: unknown. The pain is mild. She was not ambulatory at the scene. Pertinent negatives include no fever, no abdominal pain, no bowel incontinence, no nausea and no vomiting. Loss of consciousness: unknown. Treatment on scene includes a c-collar and a backboard. She has tried nothing for the symptoms.    Past Medical History  Diagnosis Date  . Alzheimer disease   . Depression   . Hypertension   . Osteoporosis   . GERD (gastroesophageal reflux disease)     History reviewed. No pertinent past surgical history.  History reviewed. No pertinent family history.  History  Substance Use Topics  . Smoking status: Not on file  . Smokeless tobacco: Not on file  . Alcohol Use:     OB History    Grav Para Term Preterm Abortions TAB SAB Ect Mult Living                  Review of Systems  Unable to perform ROS: Dementia  Constitutional: Negative for fever.  HENT: Negative for neck pain.   Cardiovascular: Negative for chest pain.  Gastrointestinal: Negative for nausea, vomiting, abdominal pain and bowel incontinence.  Musculoskeletal: Negative for back pain.  Neurological: Loss of consciousness: unknown.    Allergies  Iodine and Shellfish allergy  Home Medications  No current outpatient prescriptions on file.  BP  181/66  Pulse 84  Temp(Src) 97.6 F (36.4 C) (Oral)  SpO2 96%  Physical Exam  Nursing note and vitals reviewed. Constitutional: She appears well-developed and well-nourished.  HENT:  Head: Normocephalic.  Right Ear: External ear normal.  Left Ear: External ear normal.  Mouth/Throat:         Superficial lac to anterior nose. Lac to interior upper gum; does not cross into lip.  Eyes: EOM are normal. Pupils are equal, round, and reactive to light.  Neck:       No midline ttp  Cardiovascular: Normal rate, regular rhythm and normal heart sounds.   Pulmonary/Chest: Effort normal and breath sounds normal. No respiratory distress. She exhibits tenderness (mild, upper L chest wall).  Abdominal: Soft. Bowel sounds are normal. There is no tenderness.  Musculoskeletal: She exhibits no tenderness.  Neurological: She is alert.       Oriented to name, daughter, hospital. Unknown date, president. Per daughter is baseline.  Skin: Skin is warm and dry.  Psychiatric: She has a normal mood and affect.    ED Course  LACERATION REPAIR Date/Time: 01/18/2011 12:41 AM Performed by: Theotis Burrow Authorized by: Nicholes Stairs Consent: Verbal consent obtained. Written consent not obtained. Risks and benefits: risks, benefits and alternatives were discussed Consent given by: daughter. Time out: Immediately prior to procedure a "time out" was called to verify the correct patient, procedure, equipment, support staff and site/side marked as required. Body area: head/neck Location details: nose  Laceration length: 2 cm Foreign bodies: no foreign bodies Tendon involvement: none Nerve involvement: none Vascular damage: no Patient sedated: no Irrigation solution: saline Amount of cleaning: standard Skin closure: glue Approximation: close Approximation difficulty: simple Patient tolerance: Patient tolerated the procedure well with no immediate complications.   (including critical  care time)  Results for orders placed during the hospital encounter of 01/17/11  URINALYSIS, ROUTINE W REFLEX MICROSCOPIC      Component Value Range   Color, Urine YELLOW  YELLOW    Appearance CLEAR  CLEAR    Specific Gravity, Urine 1.021  1.005 - 1.030    pH 7.0  5.0 - 8.0    Glucose, UA NEGATIVE  NEGATIVE (mg/dL)   Hgb urine dipstick NEGATIVE  NEGATIVE    Bilirubin Urine NEGATIVE  NEGATIVE    Ketones, ur NEGATIVE  NEGATIVE (mg/dL)   Protein, ur NEGATIVE  NEGATIVE (mg/dL)   Urobilinogen, UA 1.0  0.0 - 1.0 (mg/dL)   Nitrite NEGATIVE  NEGATIVE    Leukocytes, UA NEGATIVE  NEGATIVE     Labs Reviewed  URINALYSIS, ROUTINE W REFLEX MICROSCOPIC   Ct Head Wo Contrast  01/17/2011  *RADIOLOGY REPORT*  Clinical Data:  Found on floor, abrasion to nose, history Alzheimer's, hypertension, COPD  CT HEAD WITHOUT CONTRAST CT CERVICAL SPINE WITHOUT CONTRAST  Technique:  Multidetector CT imaging of the head and cervical spine was performed following the standard protocol without intravenous contrast.  Multiplanar CT image reconstructions of the cervical spine were also generated.  Comparison:  12/30/2009  CT HEAD  Findings: Generalized atrophy. Normal ventricular morphology. No midline shift or mass effect. Small vessel chronic ischemic changes of deep cerebral white matter. Tiny old lacunar infarct right thalamus. No intracranial hemorrhage, mass lesion or evidence of acute infarction. No extra-axial fluid collections. Visualized paranasal sinuses and mastoid air cells clear. No acute osseous findings.  IMPRESSION: Atrophy with small vessel chronic ischemic changes of deep cerebral white matter. Tiny old right thalamic lacunar infarct. No acute intracranial abnormalities.  CT CERVICAL SPINE  Findings: Osseous demineralization. Multilevel degenerative facet disease changes cervical spine. Diffuse disc space narrowing endplate spur formation. 2 mm anterolisthesis at C4-C5 unchanged. Prevertebral soft tissues  normal thickness. Vertebral body heights maintained without fracture or subluxation. Visualized skull base intact. Biapical emphysematous changes and scarring.  IMPRESSION: Degenerative disc and facet disease changes of the cervical spine. No acute abnormalities. No interval change.  Original Report Authenticated By: Lollie Marrow, M.D.   Ct Cervical Spine Wo Contrast  01/17/2011  *RADIOLOGY REPORT*  Clinical Data:  Found on floor, abrasion to nose, history Alzheimer's, hypertension, COPD  CT HEAD WITHOUT CONTRAST CT CERVICAL SPINE WITHOUT CONTRAST  Technique:  Multidetector CT imaging of the head and cervical spine was performed following the standard protocol without intravenous contrast.  Multiplanar CT image reconstructions of the cervical spine were also generated.  Comparison:  12/30/2009  CT HEAD  Findings: Generalized atrophy. Normal ventricular morphology. No midline shift or mass effect. Small vessel chronic ischemic changes of deep cerebral white matter. Tiny old lacunar infarct right thalamus. No intracranial hemorrhage, mass lesion or evidence of acute infarction. No extra-axial fluid collections. Visualized paranasal sinuses and mastoid air cells clear. No acute osseous findings.  IMPRESSION: Atrophy with small vessel chronic ischemic changes of deep cerebral white matter. Tiny old right thalamic lacunar infarct. No acute intracranial abnormalities.  CT CERVICAL SPINE  Findings: Osseous demineralization. Multilevel degenerative facet disease changes cervical spine. Diffuse disc space narrowing endplate spur formation.  2 mm anterolisthesis at C4-C5 unchanged. Prevertebral soft tissues normal thickness. Vertebral body heights maintained without fracture or subluxation. Visualized skull base intact. Biapical emphysematous changes and scarring.  IMPRESSION: Degenerative disc and facet disease changes of the cervical spine. No acute abnormalities. No interval change.  Original Report Authenticated By:  Lollie Marrow, M.D.   Dg Chest Portable 1 View  01/17/2011  *RADIOLOGY REPORT*  Clinical Data: Chest pain, fall  PORTABLE CHEST - 1 VIEW  Comparison: Portable exam 2256 hours compared to 02/05/2010  Findings: Enlargement of cardiac silhouette. Calcified mildly tortuous thoracic aorta. Pulmonary vascularity normal. Lungs appear minimally hyperexpanded clear. No pleural effusion or pneumothorax. Bones diffusely demineralized. Callus identified at several healing right rib fractures.  IMPRESSION: Enlargement of cardiac silhouette. No acute abnormalities.  Original Report Authenticated By: Lollie Marrow, M.D.    1. Fall   2. Laceration of nose   3. Laceration of gum       MDM  This is a 77 yo F who presents from her nursing home after falling face first on the ground; unknown LOC, but was awake when found by staff, but unable to get up. Per daughter, pt is at baseline level of confusion, is unsteady on her feet, and requires a walker to walk at baseline. Pt with small lac to bridge of nose, missing R front upper incisor, which per family is normal, as well as lac to upper inner gum, that does not cross into lip. Pt with mild ttp over L upper chest wall. Is not on any blood thinners, but due to fall and age, will get CT head to evaluate for possible intracranial injury, c-spine b/c unable to clinically clear due to dementia, CXR due to ttp, and UA due to age and possible etiology of fall.  Imaging, labs unremarkable. Lac to bridge of nose glued as above, without complications. Discussed findings, treatment, follow up, indications for return, and family express understanding.       Theotis Burrow, MD 01/18/11 [phone]

## 2011-01-17 NOTE — ED Notes (Signed)
Family at bedside.   No active bleeding noted.  Pt denies any pain after backboard removed by resident.  X-ray has arrived.

## 2011-01-17 NOTE — ED Notes (Signed)
Per EMS:  Pt from [facility], staff did not see pt fall.  Pt has hx of dementia, pt was found on the floor face down in blood in vomit.  Bridge of nose is bleeding, bleeding from mouth as well.  No coumadin.  Unknown LOC.  Front tooth is noted to be broken, unsure of norm.  Pt remains confused.  Alert and disorientated.  Vitals, 180/84 HR 84, sats 95% RA.

## 2011-01-18 LAB — URINALYSIS, ROUTINE W REFLEX MICROSCOPIC
Bilirubin Urine: NEGATIVE
Glucose, UA: NEGATIVE mg/dL
Hgb urine dipstick: NEGATIVE
Protein, ur: NEGATIVE mg/dL

## 2011-01-18 NOTE — ED Provider Notes (Signed)
saw and evaluated the patient, reviewed the resident's note and I agree with the findings and plan. 77 year old female with profound dementia, who lives in a nursing home and is supposed to walk with a walker.  Was found on the floor.  Her walker was not beside her.  There is no report of recent illness.  She has a contusion on her nose.  She does not have neck tenderness.  Her chest and abdomen are benign.  Her extremities are without any deformities.  Because of her age.  We will do a head CT.  We will also perform laboratory testing, to see if there is a particular etiology for her fall.  Nicholes Stairs, MD 01/18/11 0032   Nicholes Stairs, MD 01/18/11 [phone]

## 2011-01-18 NOTE — ED Provider Notes (Signed)
I saw and evaluated the patient, reviewed the resident's note and I agree with the findings and plan. 77 year old female with profound dementia, who lives in a nursing home and is supposed to walk with a walker.  Was found on the floor.  Her walker was not beside her.  There is no report of recent illness.  She has a contusion on her nose.  She does not have neck tenderness.  Her chest and abdomen are benign.  Her extremities are without any deformities.  Because of her age.  We will do a head CT.  We will also perform laboratory testing, to see if there is a particular etiology for her fall.  Nicholes Stairs, MD 01/18/11 [phone]

## 2012-03-05 ENCOUNTER — Encounter (HOSPITAL_COMMUNITY): Payer: Self-pay | Admitting: Emergency Medicine

## 2012-03-05 ENCOUNTER — Emergency Department (HOSPITAL_COMMUNITY)
Admission: EM | Admit: 2012-03-05 | Discharge: 2012-03-05 | Disposition: A | Discharge status: Home / Self Care | Payer: Medicare Other | Attending: Emergency Medicine | Admitting: Emergency Medicine

## 2012-03-05 DIAGNOSIS — G309 Alzheimer's disease, unspecified: Secondary | ICD-10-CM | POA: Insufficient documentation

## 2012-03-05 DIAGNOSIS — M810 Age-related osteoporosis without current pathological fracture: Secondary | ICD-10-CM | POA: Insufficient documentation

## 2012-03-05 DIAGNOSIS — I10 Essential (primary) hypertension: Secondary | ICD-10-CM | POA: Insufficient documentation

## 2012-03-05 DIAGNOSIS — F0280 Dementia in other diseases classified elsewhere without behavioral disturbance: Secondary | ICD-10-CM | POA: Insufficient documentation

## 2012-03-05 DIAGNOSIS — W19XXXA Unspecified fall, initial encounter: Secondary | ICD-10-CM

## 2012-03-05 DIAGNOSIS — F3289 Other specified depressive episodes: Secondary | ICD-10-CM | POA: Insufficient documentation

## 2012-03-05 DIAGNOSIS — Y939 Activity, unspecified: Secondary | ICD-10-CM | POA: Insufficient documentation

## 2012-03-05 DIAGNOSIS — Z79899 Other long term (current) drug therapy: Secondary | ICD-10-CM | POA: Insufficient documentation

## 2012-03-05 DIAGNOSIS — F0390 Unspecified dementia without behavioral disturbance: Secondary | ICD-10-CM

## 2012-03-05 DIAGNOSIS — F329 Major depressive disorder, single episode, unspecified: Secondary | ICD-10-CM | POA: Insufficient documentation

## 2012-03-05 DIAGNOSIS — S0990XA Unspecified injury of head, initial encounter: Secondary | ICD-10-CM | POA: Insufficient documentation

## 2012-03-05 DIAGNOSIS — K219 Gastro-esophageal reflux disease without esophagitis: Secondary | ICD-10-CM | POA: Insufficient documentation

## 2012-03-05 DIAGNOSIS — W1809XA Striking against other object with subsequent fall, initial encounter: Secondary | ICD-10-CM | POA: Insufficient documentation

## 2012-03-05 DIAGNOSIS — Y929 Unspecified place or not applicable: Secondary | ICD-10-CM | POA: Insufficient documentation

## 2012-03-05 NOTE — Progress Notes (Signed)
CSW met with pt, pt  daughter Donnal Debar, and pt son in law at bedside. Pt has history of dementia. Pt daughter confirmed that patient is a resident at [facility] and plans to return when medically stable.  Please call csw if further needs arise.  Catha Gosselin, LCSWA  [phone] 03/05/2012 1838pm

## 2012-03-05 NOTE — ED Notes (Signed)
Per EMS: from assisted living [facility] [place]), dementia, no AMS. Slid out of a chair and bump back of head on bottom of chair, small hematoma and abrasions. No LOC, neck pain, back pain. A&O x3

## 2012-03-05 NOTE — ED Provider Notes (Signed)
History     CSN: [id number]  Arrival date & time 03/05/12  1744   First MD Initiated Contact with Patient 03/05/12 2100      Chief Complaint  Patient presents with  . Fall    (Consider location/radiation/quality/duration/timing/severity/associated sxs/prior treatment) HPI This 79 year old female has dementia and had a witnessed fall as she slid out of her chair in the dining room and bumped the back of her head on the bottom of her chair, there was no loss of consciousness, no change in behavior, the patient denies any complaints, she is happy and laughing, she is no headache neck pain back pain chest pain shortness breath abdominal pain or injury to her extremities, she is acting at her baseline and her family is comfortable with no imaging, there is been no bleeding and no apparent injury other than apparently prior to arrival he thought there might have been a small bump on her head which is now gone. There is no treatment prior to arrival. Past Medical History  Diagnosis Date  . Alzheimer disease   . Depression   . Hypertension   . Osteoporosis   . GERD (gastroesophageal reflux disease)     History reviewed. No pertinent past surgical history.  No family history on file.  History  Substance Use Topics  . Smoking status: Never Smoker   . Smokeless tobacco: Not on file  . Alcohol Use: No    OB History    Grav Para Term Preterm Abortions TAB SAB Ect Mult Living                  Review of Systems 10 Systems reviewed and are negative for acute change except as noted in the HPI. Allergies  Iodine and Shellfish allergy  Home Medications   Current Outpatient Rx  Name  Route  Sig  Dispense  Refill  . ACETAMINOPHEN 500 MG PO TABS   Oral   Take 500 mg by mouth every 6 (six) hours as needed. pain         . AZITHROMYCIN 250 MG PO TABS   Oral   Take 250-500 mg by mouth daily. Takes 2 tablets on day 1, then takes 1 tablet daily for daily 2-5         . DILTIAZEM  HCL 30 MG PO TABS   Oral   Take 30 mg by mouth 2 (two) times daily. Hold if blood pressure is less than 60         . ESCITALOPRAM OXALATE 20 MG PO TABS   Oral   Take 20 mg by mouth every morning.         Marland Kitchen ENSURE IMMUNE HEALTH PO LIQD   Oral   Take 237 mLs by mouth 2 (two) times daily.         Marland Kitchen MIRTAZAPINE 15 MG PO TABS   Oral   Take 7.5 mg by mouth at bedtime.         Marland Kitchen ZINC OXIDE 20 % EX OINT   Topical   Apply 1 application topically 3 (three) times daily.           BP 172/93  Pulse 100  Temp 98 F (36.7 C) (Oral)  Resp 16  SpO2 96%  Physical Exam  Nursing note and vitals reviewed. Constitutional:       Awake, alert, nontoxic appearance with baseline speech for patient.  HENT:  Head: Atraumatic.  Mouth/Throat: No oropharyngeal exudate.  Eyes: EOM are normal. Pupils are  equal, round, and reactive to light. Right eye exhibits no discharge. Left eye exhibits no discharge.  Neck: Neck supple.  Cardiovascular: Normal rate and regular rhythm.   No murmur heard. Pulmonary/Chest: Effort normal and breath sounds normal. No stridor. No respiratory distress. She has no wheezes. She has no rales. She exhibits no tenderness.  Abdominal: Soft. Bowel sounds are normal. She exhibits no mass. There is no tenderness. There is no rebound.  Musculoskeletal: She exhibits no tenderness.       Baseline ROM, moves extremities with no obvious new focal weakness. No tenderness to her arms or legs including her hips.  Lymphadenopathy:    She has no cervical adenopathy.  Neurological: She is alert.       Awake, alert, cooperative and pleasantly demented; motor strength bilaterally; sensation normal to light touch bilaterally; peripheral visual fields full to confrontation; no facial asymmetry; tongue midline; major cranial nerves appear intact; no pronator drift, normal finger to nose bilaterally  Skin: No rash noted.  Psychiatric: She has a normal mood and affect.    ED Course    Procedures (including critical care time)  Labs Reviewed - No data to display No results found.   1. Fall   2. Dementia       MDM  Pt stable in ED with no significant deterioration in condition.Patient / Family / Caregiver informed of clinical course, understand medical decision-making process, and agree with plan. Family prefers no imaging.  I doubt any other EMC precluding discharge at this time including, but not necessarily limited to the following:TBI.         Hurman Horn, MD 03/06/12 1430

## 2012-03-05 NOTE — ED Notes (Signed)
Notified RN of elevated BP

## 2012-03-05 NOTE — ED Notes (Signed)
[id number]<NF> Expected date:<BR> Expected time:<BR> Means of arrival:<BR> Comments:<BR> Hold for hall patient

## 2012-08-08 ENCOUNTER — Inpatient Hospital Stay (HOSPITAL_COMMUNITY)
Admission: EM | Admit: 2012-08-08 | Discharge: 2012-08-10 | DRG: 292 | Disposition: A | Discharge status: Skilled Nursing Facility | Payer: Medicare Other | Attending: Internal Medicine | Admitting: Internal Medicine

## 2012-08-08 ENCOUNTER — Emergency Department (HOSPITAL_COMMUNITY): Payer: Medicare Other

## 2012-08-08 ENCOUNTER — Encounter (HOSPITAL_COMMUNITY): Payer: Self-pay | Admitting: Emergency Medicine

## 2012-08-08 DIAGNOSIS — K219 Gastro-esophageal reflux disease without esophagitis: Secondary | ICD-10-CM | POA: Diagnosis present

## 2012-08-08 DIAGNOSIS — J90 Pleural effusion, not elsewhere classified: Secondary | ICD-10-CM | POA: Diagnosis present

## 2012-08-08 DIAGNOSIS — F0280 Dementia in other diseases classified elsewhere without behavioral disturbance: Secondary | ICD-10-CM | POA: Diagnosis present

## 2012-08-08 DIAGNOSIS — I517 Cardiomegaly: Secondary | ICD-10-CM | POA: Diagnosis present

## 2012-08-08 DIAGNOSIS — F331 Major depressive disorder, recurrent, moderate: Secondary | ICD-10-CM

## 2012-08-08 DIAGNOSIS — Z993 Dependence on wheelchair: Secondary | ICD-10-CM

## 2012-08-08 DIAGNOSIS — Z66 Do not resuscitate: Secondary | ICD-10-CM | POA: Diagnosis present

## 2012-08-08 DIAGNOSIS — J189 Pneumonia, unspecified organism: Secondary | ICD-10-CM

## 2012-08-08 DIAGNOSIS — Z79899 Other long term (current) drug therapy: Secondary | ICD-10-CM

## 2012-08-08 DIAGNOSIS — F329 Major depressive disorder, single episode, unspecified: Secondary | ICD-10-CM | POA: Diagnosis present

## 2012-08-08 DIAGNOSIS — R0602 Shortness of breath: Secondary | ICD-10-CM | POA: Diagnosis present

## 2012-08-08 DIAGNOSIS — G309 Alzheimer's disease, unspecified: Secondary | ICD-10-CM | POA: Diagnosis present

## 2012-08-08 DIAGNOSIS — I10 Essential (primary) hypertension: Secondary | ICD-10-CM

## 2012-08-08 DIAGNOSIS — I5031 Acute diastolic (congestive) heart failure: Principal | ICD-10-CM

## 2012-08-08 DIAGNOSIS — I509 Heart failure, unspecified: Secondary | ICD-10-CM | POA: Diagnosis present

## 2012-08-08 DIAGNOSIS — J449 Chronic obstructive pulmonary disease, unspecified: Secondary | ICD-10-CM

## 2012-08-08 DIAGNOSIS — F3289 Other specified depressive episodes: Secondary | ICD-10-CM | POA: Diagnosis present

## 2012-08-08 DIAGNOSIS — J948 Other specified pleural conditions: Secondary | ICD-10-CM

## 2012-08-08 DIAGNOSIS — R7989 Other specified abnormal findings of blood chemistry: Secondary | ICD-10-CM | POA: Diagnosis present

## 2012-08-08 DIAGNOSIS — M810 Age-related osteoporosis without current pathological fracture: Secondary | ICD-10-CM

## 2012-08-08 DIAGNOSIS — I5021 Acute systolic (congestive) heart failure: Secondary | ICD-10-CM

## 2012-08-08 LAB — CBC
HCT: 37.8 % (ref 36.0–46.0)
Hemoglobin: 11.8 g/dL — ABNORMAL LOW (ref 12.0–15.0)
MCH: 29.1 pg (ref 26.0–34.0)
MCHC: 31.2 g/dL (ref 30.0–36.0)
MCV: 93.3 fL (ref 78.0–100.0)
Platelets: 258 10*3/uL (ref 150–400)
RBC: 4.05 MIL/uL (ref 3.87–5.11)
RDW: 13.3 % (ref 11.5–15.5)
WBC: 10.6 10*3/uL — ABNORMAL HIGH (ref 4.0–10.5)

## 2012-08-08 LAB — BASIC METABOLIC PANEL WITH GFR
CO2: 27 meq/L (ref 19–32)
GFR calc non Af Amer: 76 mL/min — ABNORMAL LOW (ref >90)
Glucose, Bld: 85 mg/dL (ref 70–99)
Potassium: 4.4 meq/L (ref 3.5–5.1)
Sodium: 139 meq/L (ref 135–145)

## 2012-08-08 LAB — POCT I-STAT TROPONIN I: Troponin i, poc: 0.04 ng/mL (ref 0.00–0.08)

## 2012-08-08 LAB — BASIC METABOLIC PANEL
BUN: 20 mg/dL (ref 6–23)
Calcium: 9.1 mg/dL (ref 8.4–10.5)
Chloride: 103 mEq/L (ref 96–112)
Creatinine, Ser: 0.59 mg/dL (ref 0.50–1.10)
GFR calc Af Amer: 88 mL/min — ABNORMAL LOW (ref >90)

## 2012-08-08 LAB — MRSA PCR SCREENING: MRSA by PCR: NEGATIVE

## 2012-08-08 MED ORDER — ONDANSETRON HCL 4 MG/2ML IJ SOLN: 4.0000 mg | Freq: Four times a day (QID) | INTRAMUSCULAR | Status: DC | PRN

## 2012-08-08 MED ORDER — ACETAMINOPHEN 650 MG RE SUPP: 650.0000 mg | Freq: Four times a day (QID) | RECTAL | Status: DC | PRN

## 2012-08-08 MED ORDER — HALOPERIDOL 1 MG PO TABS
1.0000 mg | ORAL_TABLET | Freq: Four times a day (QID) | ORAL | Status: DC | PRN
  Administered 2012-08-08: 1 mg via ORAL
  Filled 2012-08-08 (×2): qty 1

## 2012-08-08 MED ORDER — ALBUTEROL SULFATE (5 MG/ML) 0.5% IN NEBU
5.0000 mg | INHALATION_SOLUTION | Freq: Once | RESPIRATORY_TRACT | Status: AC
  Administered 2012-08-08: 5 mg via RESPIRATORY_TRACT
  Filled 2012-08-08: qty 1

## 2012-08-08 MED ORDER — LORAZEPAM 2 MG/ML IJ SOLN
0.5000 mg | Freq: Once | INTRAMUSCULAR | Status: AC
  Administered 2012-08-08: 0.5 mg via INTRAVENOUS
  Filled 2012-08-08: qty 1

## 2012-08-08 MED ORDER — SODIUM CHLORIDE 0.9 % IV SOLN
INTRAVENOUS | Status: AC
  Administered 2012-08-08: 11:00:00 via INTRAVENOUS

## 2012-08-08 MED ORDER — ACETAMINOPHEN 325 MG PO TABS: 650.0000 mg | ORAL_TABLET | Freq: Four times a day (QID) | ORAL | Status: DC | PRN

## 2012-08-08 MED ORDER — ESCITALOPRAM OXALATE 20 MG PO TABS
20.0000 mg | ORAL_TABLET | Freq: Every morning | ORAL | Status: DC
  Administered 2012-08-08 – 2012-08-10 (×3): 20 mg via ORAL
  Filled 2012-08-08 (×3): qty 1

## 2012-08-08 MED ORDER — FUROSEMIDE 10 MG/ML IJ SOLN
40.0000 mg | INTRAMUSCULAR | Status: AC
  Administered 2012-08-08: 40 mg via INTRAVENOUS
  Filled 2012-08-08: qty 4

## 2012-08-08 MED ORDER — SENNOSIDES-DOCUSATE SODIUM 8.6-50 MG PO TABS
1.0000 | ORAL_TABLET | Freq: Every evening | ORAL | Status: DC | PRN
  Filled 2012-08-08: qty 1

## 2012-08-08 MED ORDER — ONDANSETRON HCL 4 MG PO TABS: 4.0000 mg | ORAL_TABLET | Freq: Four times a day (QID) | ORAL | Status: DC | PRN

## 2012-08-08 MED ORDER — ENOXAPARIN SODIUM 30 MG/0.3ML ~~LOC~~ SOLN
30.0000 mg | SUBCUTANEOUS | Status: DC
  Administered 2012-08-08 – 2012-08-09 (×2): 30 mg via SUBCUTANEOUS
  Filled 2012-08-08 (×3): qty 0.3

## 2012-08-08 MED ORDER — LORAZEPAM 0.5 MG PO TABS
0.2500 mg | ORAL_TABLET | ORAL | Status: DC | PRN
  Administered 2012-08-08 (×2): 0.25 mg via ORAL
  Filled 2012-08-08 (×2): qty 1

## 2012-08-08 MED ORDER — ENSURE IMMUNE HEALTH PO LIQD: 237.0000 mL | Freq: Two times a day (BID) | ORAL | Status: DC

## 2012-08-08 MED ORDER — DILTIAZEM HCL 30 MG PO TABS
30.0000 mg | ORAL_TABLET | Freq: Two times a day (BID) | ORAL | Status: DC
  Administered 2012-08-08 – 2012-08-10 (×5): 30 mg via ORAL
  Filled 2012-08-08 (×6): qty 1

## 2012-08-08 MED ORDER — FUROSEMIDE 10 MG/ML IJ SOLN
20.0000 mg | Freq: Two times a day (BID) | INTRAMUSCULAR | Status: DC
  Administered 2012-08-08 – 2012-08-09 (×2): 20 mg via INTRAVENOUS
  Filled 2012-08-08 (×4): qty 2

## 2012-08-08 MED ORDER — LORAZEPAM 0.5 MG PO TABS
0.5000 mg | ORAL_TABLET | ORAL | Status: DC | PRN
  Administered 2012-08-08 – 2012-08-10 (×2): 0.5 mg via ORAL
  Filled 2012-08-08 (×2): qty 1

## 2012-08-08 MED ORDER — SODIUM CHLORIDE 0.9 % IJ SOLN: 3.0000 mL | INTRAMUSCULAR | Status: DC | PRN

## 2012-08-08 MED ORDER — SODIUM CHLORIDE 0.9 % IJ SOLN
3.0000 mL | Freq: Two times a day (BID) | INTRAMUSCULAR | Status: DC
  Administered 2012-08-08: 3 mL via INTRAVENOUS

## 2012-08-08 MED ORDER — MIRTAZAPINE 7.5 MG PO TABS
7.5000 mg | ORAL_TABLET | Freq: Every day | ORAL | Status: DC
  Administered 2012-08-08 – 2012-08-10 (×2): 7.5 mg via ORAL
  Filled 2012-08-08 (×3): qty 1

## 2012-08-08 MED ORDER — ENSURE COMPLETE PO LIQD
237.0000 mL | Freq: Two times a day (BID) | ORAL | Status: DC
  Administered 2012-08-08 – 2012-08-10 (×4): 237 mL via ORAL

## 2012-08-08 MED ORDER — SODIUM CHLORIDE 0.9 % IV SOLN: 250.0000 mL | INTRAVENOUS | Status: DC | PRN

## 2012-08-08 MED ORDER — SODIUM CHLORIDE 0.9 % IJ SOLN
3.0000 mL | Freq: Two times a day (BID) | INTRAMUSCULAR | Status: DC
  Administered 2012-08-08 – 2012-08-09 (×2): 3 mL via INTRAVENOUS

## 2012-08-08 NOTE — H&P (Signed)
[facility] History and Physical  Natasha Cline ZOX:[id number] DOB: 1915/12/03 DOA: 08/08/2012  Referring physician: ERFreida Cline PCP: No primary provider on file.  Specialists:   Chief Complaint: SOB  HPI: Natasha Cline is a 79 y.o. female  Brought in by EMS from her memory care unit c/o SOB.  Cp was also reported although she is not CP free.  SOB started this AM about 5.  Patient has dementia and can not relay any information- says she feels great!  Her baseline per daughter is confused with clear speech and oriented to person.    No fever, no chills  In the ER, an x ray of the chest showed new pleural effusions and her BNP was elevated hospitalist were called for the admission   Review of Systems: unable to do ROS due to dementia and hard of hearing    Past Medical History  Diagnosis Date  . Alzheimer disease   . Depression   . Hypertension   . Osteoporosis   . GERD (gastroesophageal reflux disease)    Past Surgical History  Procedure Laterality Date  . Back surgery     Social History:  reports that she has never smoked. She does not have any smokeless tobacco history on file. She reports that she does not drink alcohol or use illicit drugs. Gets around in wheelchair at memory care- [facility]  Allergies  Allergen Reactions  . Iodine     Reaction unknown  . Shellfish Allergy     Reaction unknown    Family Hx; HTN  Prior to Admission medications   Medication Sig Start Date End Date Taking? Authorizing Provider  diltiazem (CARDIZEM) 30 MG tablet Take 30 mg by mouth 2 (two) times daily. Hold if blood pressure is less than 60   Yes Historical Provider, MD  escitalopram (LEXAPRO) 20 MG tablet Take 20 mg by mouth every morning.   Yes Historical Provider, MD  feeding supplement (ENSURE IMMUNE HEALTH) LIQD Take 237 mLs by mouth 2 (two) times daily.   Yes Historical Provider, MD  LORazepam (ATIVAN) 0.5 MG tablet Take 0.25 mg by mouth every 4 (four)  hours as needed (agitation).   Yes Historical Provider, MD  mirtazapine (REMERON) 15 MG tablet Take 7.5 mg by mouth at bedtime.   Yes Historical Provider, MD  zinc oxide 20 % ointment Apply 1 application topically 3 (three) times daily.   Yes Historical Provider, MD   Physical Exam: Filed Vitals:   08/08/12 0715 08/08/12 0800 08/08/12 0849 08/08/12 0945  BP:   146/96 149/69  Pulse: 99 98  94  Temp:    97.5 F (36.4 C)  TempSrc:    Oral  Resp: 25 22 21 18   SpO2: 95% 96% 97% 97%     General:  Pleasant/cooperative- answers some questions approriately  Eyes: wnl  ENT: wnl  Neck: supple  Cardiovascular: rrr  Respiratory: decreased breath sounds at bases  Abdomen: +BS, soft  Skin: no rashes or lesions  Musculoskeletal: moves all 4 ext- no swelling  Psychiatric: demented but pleasant  Neurologic: CN 2-12 intact  Labs on Admission:  Basic Metabolic Panel:  Recent Labs Lab 08/08/12 0610  NA 139  K 4.4  CL 103  CO2 27  GLUCOSE 85  BUN 20  CREATININE 0.59  CALCIUM 9.1   Liver Function Tests: No results found for this basename: AST, ALT, ALKPHOS, BILITOT, PROT, ALBUMIN,  in the last 168 hours No results found for this basename:  LIPASE, AMYLASE,  in the last 168 hours No results found for this basename: AMMONIA,  in the last 168 hours CBC:  Recent Labs Lab 08/08/12 0610  WBC 10.6*  HGB 11.8*  HCT 37.8  MCV 93.3  PLT 258   Cardiac Enzymes: No results found for this basename: CKTOTAL, CKMB, CKMBINDEX, TROPONINI,  in the last 168 hours  BNP (last 3 results)  Recent Labs  08/08/12 0630  PROBNP 2896.0*   CBG: No results found for this basename: GLUCAP,  in the last 168 hours  Radiological Exams on Admission: Dg Chest 2 View (if Patient Has Fever And/or Copd)  08/08/2012   *RADIOLOGY REPORT*  Clinical Data: Shortness of breath, chest pain.  CHEST - 2 VIEW  Comparison: 01/17/2011  Findings: New pleural effusions, right greater than left.  Adjacent  atelectasis/consolidation at the right lung base in the right lower lobe.  Mild cardiomegaly.  Atheromatous aorta.  Linear opacities around the right hilum.  Regional bones unremarkable.  IMPRESSION:  Stable cardiomegaly with new pleural effusions, right greater than left.   Original Report Authenticated By: D. Andria Rhein, MD    EKG: Independently reviewed. Sinus- some artifact- repeat  Assessment/Plan Principal Problem:   SOB (shortness of breath) Active Problems:   Alzheimer's disease   Pleural effusion   Elevated brain natriuretic peptide (BNP) level   1. SOB from pleural effusions- tele, IV lasix and monitor I/Os and daily weights, check echo- wean off O2 2. Dementia- at baseline 3. Weakness- uses wheelchair at memory care unit- social work consult for going back 4. DNR- family is realist with work up    Code Status: DNR- discussed with family Family Communication: daughter at bedside Disposition Plan: back to memory care when work up complete  Time spent: 70 min  Marlin Canary [facility] Pager [phone]  If 7PM-7AM, please contact night-coverage [email or web address] Password [email or web address] 08/08/2012, 10:06 AM

## 2012-08-08 NOTE — Progress Notes (Signed)
Pt increasingly agitated. Pulled out IV and trying to get out of bed. Dr. Benjamine Mola notified, PRN haldol ordered. Will continue to monitor. Margalit Leece, Lavone Orn, RN

## 2012-08-08 NOTE — ED Notes (Signed)
ZOX:WRUE<AV> Expected date:08/08/12<BR> Expected time: 5:21 AM<BR> Means of arrival:Ambulance<BR> Comments:<BR> Res B: SoB

## 2012-08-08 NOTE — Progress Notes (Signed)
Clinical Social Work Department BRIEF PSYCHOSOCIAL ASSESSMENT 08/08/2012  Patient:  Natasha Cline, Natasha Cline     Account Number:  [id number]     Admit date:  08/08/2012  Clinical Social Worker:  Doroteo Glassman  Date/Time:  08/08/2012 02:20 PM  Referred by:  Physician  Date Referred:  08/08/2012 Referred for  ALF Placement   Other Referral:   Interview type:  Other - See comment Other interview type:   Pt's daughter, Mimi, via phone    PSYCHOSOCIAL DATA Living Status:  FACILITY Admitted from facility:  [facility] Level of care:  Assisted Living Primary support name:  Mimi Primary support relationship to patient:  CHILD, ADULT Degree of support available:   strong    CURRENT CONCERNS Current Concerns  Post-Acute Placement   Other Concerns:    SOCIAL WORK ASSESSMENT / PLAN Spoke with Pt's daughter, Mimi, re: d/c plans.    Mimi confirmed that Pt is from [facility] and that Pt will return there upon d/c.    CSW thanked Mimi for her time.    Spoke with [employer].    Informed that Pt is a resident at [facility] and that she is able to return upon d/c.    Weekday CSW to follow.   Assessment/plan status:  Psychosocial Support/Ongoing Assessment of Needs Other assessment/ plan:   Information/referral to community resources:   n/a    PATIENT'S/FAMILY'S RESPONSE TO PLAN OF CARE: Pt's daughter thanked CSW for time and assistance.   Providence Crosby, LCSWA Clinical Social Work [phone]

## 2012-08-08 NOTE — ED Notes (Addendum)
Pt highly agitated at this time, unable to reassess BP at this time.  Pt does have a hx of hypertension

## 2012-08-08 NOTE — ED Notes (Signed)
Unable to pull labs off of IV, phlebo aware of need to draw.

## 2012-08-08 NOTE — ED Notes (Signed)
Per EMS, pt awoken this morning for v/s at [facility], began to c/o chest pain and ShOB.  Pt hx dementia, screaming upon admission to ED.  Pt has not taken her morning Ativan and Lexapro and usually takes these early in the morning.  Pt was awoken earlier than usual this morning.

## 2012-08-08 NOTE — ED Notes (Signed)
I have just phoned report to Maralyn Sago on [address], and will take pt. To floor now with con't ecg monitoring.

## 2012-08-08 NOTE — ED Notes (Signed)
She is awake, alert and visiting with her daughter.  She is confused, but her speech is clear and she is oriented to situation (only), which her daughter tells me is her baseline.  She is pale, and her skin is warm and dry.  Her respirations are shallow, and at a rate of 28-30.  She is in no distress and smiles as she speaks with [place].

## 2012-08-08 NOTE — ED Provider Notes (Signed)
History     CSN: [id number]  Arrival date & time 08/08/12  0535   First MD Initiated Contact with Patient 08/08/12 [phone]      Chief Complaint  Patient presents with  . Shortness of Breath  . Chest Pain    (Consider location/radiation/quality/duration/timing/severity/associated sxs/prior treatment) HPI Comments: Patient is a 79 year old female past medical history significant for hypertension, dementia, depression, GERD presented to the emergency department EMS from [facility] for complaint of CP and SOB this morning around 5 AM. Daughter states her mother had been feeling well up until this morning, but she had noticed a slight non-productive cough that started while she visited her mom a few days ago. Patient is level V caveat.    The history is provided by the EMS personnel and a relative.      Past Medical History  Diagnosis Date  . Alzheimer disease   . Depression   . Hypertension   . Osteoporosis   . GERD (gastroesophageal reflux disease)     History reviewed. No pertinent past surgical history.  History reviewed. No pertinent family history.  History  Substance Use Topics  . Smoking status: Never Smoker   . Smokeless tobacco: Not on file  . Alcohol Use: No    OB History   Grav Para Term Preterm Abortions TAB SAB Ect Mult Living                  Review of Systems  Unable to perform ROS: Dementia    Allergies  Iodine and Shellfish allergy  Home Medications   Current Outpatient Rx  Name  Route  Sig  Dispense  Refill  . diltiazem (CARDIZEM) 30 MG tablet   Oral   Take 30 mg by mouth 2 (two) times daily. Hold if blood pressure is less than 60         . escitalopram (LEXAPRO) 20 MG tablet   Oral   Take 20 mg by mouth every morning.         . feeding supplement (ENSURE IMMUNE HEALTH) LIQD   Oral   Take 237 mLs by mouth 2 (two) times daily.         Marland Kitchen LORazepam (ATIVAN) 0.5 MG tablet   Oral   Take 0.25 mg by mouth every 4 (four) hours as  needed (agitation).         . mirtazapine (REMERON) 15 MG tablet   Oral   Take 7.5 mg by mouth at bedtime.         Marland Kitchen zinc oxide 20 % ointment   Topical   Apply 1 application topically 3 (three) times daily.           BP 182/116  Pulse 96  Temp(Src) 97.9 F (36.6 C) (Oral)  Resp 26  SpO2 91%  Physical Exam  Constitutional: She is oriented to person, place, and time. She appears well-developed and well-nourished. No distress.  HENT:  Head: Normocephalic and atraumatic.  Mouth/Throat: Oropharynx is clear and moist.  Eyes: Conjunctivae and EOM are normal.  Neck: Neck supple.  Cardiovascular: Normal rate, regular rhythm, normal heart sounds and intact distal pulses.   Pulmonary/Chest: Effort normal and breath sounds normal.  Diminished breath sounds bilateral bases   Abdominal: Soft. Bowel sounds are normal.  Musculoskeletal: She exhibits no edema.  Neurological: She is alert and oriented to person, place, and time.  Skin: Skin is warm and dry. She is not diaphoretic.  Psychiatric: She has a normal mood and affect.  ED Course  Procedures (including critical care time)   Date: 08/08/2012  Rate: 94  Rhythm: sinus rhythm  QRS Axis: normal  Intervals: normal  ST/T Wave abnormalities: early repolarization  Conduction Disutrbances:first-degree A-V block   Narrative Interpretation:   Old EKG Reviewed: unchanged    Labs Reviewed  BASIC METABOLIC PANEL - Abnormal; Notable for the following:    GFR calc non Af Amer 76 (*)    GFR calc Af Amer 88 (*)    All other components within normal limits  CBC - Abnormal; Notable for the following:    WBC 10.6 (*)    Hemoglobin 11.8 (*)    All other components within normal limits  PRO B NATRIURETIC PEPTIDE - Abnormal; Notable for the following:    Pro B Natriuretic peptide (BNP) 2896.0 (*)    All other components within normal limits  POCT I-STAT TROPONIN I   Dg Chest 2 View (if Patient Has Fever And/or  Copd)  08/08/2012   *RADIOLOGY REPORT*  Clinical Data: Shortness of breath, chest pain.  CHEST - 2 VIEW  Comparison: 01/17/2011  Findings: New pleural effusions, right greater than left.  Adjacent atelectasis/consolidation at the right lung base in the right lower lobe.  Mild cardiomegaly.  Atheromatous aorta.  Linear opacities around the right hilum.  Regional bones unremarkable.  IMPRESSION:  Stable cardiomegaly with new pleural effusions, right greater than left.   Original Report Authenticated By: D. Andria Rhein, MD     1. CHF (congestive heart failure), acute, systolic       MDM  Pt w/ acute CHF. Given 40mg  IV Lasix in ED. The patient appears reasonably stabilized for admission considering the current resources, flow, and capabilities available in the ED at this time, and I doubt any other [facility] requiring further screening and/or treatment in the ED prior to admission. Patient d/w with Dr. Oletta Lamas, agrees with plan. Patient is stable and ready for transport.            Jeannetta Ellis, PA-C 08/08/12 1532

## 2012-08-08 NOTE — Progress Notes (Signed)
Pt severely agitated by SCDs. MD notified. Haldol given. SCDs dc'd.  Adelyna Brockman, Lavone Orn, RN

## 2012-08-08 NOTE — ED Notes (Signed)
Called lab, stated that the BNP can be ran off of the BMP tube at this time.

## 2012-08-09 ENCOUNTER — Inpatient Hospital Stay (HOSPITAL_COMMUNITY): Payer: Medicare Other

## 2012-08-09 DIAGNOSIS — I5021 Acute systolic (congestive) heart failure: Secondary | ICD-10-CM

## 2012-08-09 DIAGNOSIS — J948 Other specified pleural conditions: Secondary | ICD-10-CM

## 2012-08-09 DIAGNOSIS — R799 Abnormal finding of blood chemistry, unspecified: Secondary | ICD-10-CM

## 2012-08-09 DIAGNOSIS — I509 Heart failure, unspecified: Secondary | ICD-10-CM

## 2012-08-09 LAB — URINALYSIS W MICROSCOPIC + REFLEX CULTURE
Glucose, UA: NEGATIVE mg/dL
Ketones, ur: NEGATIVE mg/dL
Nitrite: POSITIVE — AB
Protein, ur: NEGATIVE mg/dL
Urobilinogen, UA: 0.2 mg/dL (ref 0.0–1.0)

## 2012-08-09 LAB — BASIC METABOLIC PANEL
BUN: 21 mg/dL (ref 6–23)
Calcium: 9.4 mg/dL (ref 8.4–10.5)
Creatinine, Ser: 0.87 mg/dL (ref 0.50–1.10)
GFR calc Af Amer: 64 mL/min — ABNORMAL LOW (ref >90)
GFR calc non Af Amer: 55 mL/min — ABNORMAL LOW (ref >90)
Glucose, Bld: 93 mg/dL (ref 70–99)

## 2012-08-09 LAB — CBC
MCH: 30.7 pg (ref 26.0–34.0)
MCHC: 33.1 g/dL (ref 30.0–36.0)
Platelets: 256 10*3/uL (ref 150–400)
RDW: 13.2 % (ref 11.5–15.5)

## 2012-08-09 LAB — PRO B NATRIURETIC PEPTIDE: Pro B Natriuretic peptide (BNP): 6214 pg/mL — ABNORMAL HIGH (ref 0–450)

## 2012-08-09 MED ORDER — FUROSEMIDE 10 MG/ML IJ SOLN: 20.0000 mg | Freq: Every day | INTRAMUSCULAR | Status: DC

## 2012-08-09 NOTE — Progress Notes (Signed)
Pt was very agitated and confused. Pt was pulling on IV lines and trying to get out of bed several times. The MD on call was notified and new orders were placed to put the pt in restraints. I will continue to monitor the patient. Roosvelt Maser, RN

## 2012-08-09 NOTE — Progress Notes (Signed)
Restraints discontinued , no longer appropriate for patients behavior at this time. Patient asleep/resting.

## 2012-08-09 NOTE — Progress Notes (Signed)
I was contacted by Dr. Charline Bills from radiology and informed that the patient had an right-sided Hydropneumothorax. I spoke with the patient's daughters Barnetta Chapel, Armelia, Penton, and Harriett Sine, all separately.  Each daughter was updated separately.  I consulted CardioThoracic surgery to evaluate the patient.  Pt's vitals are stable.  Continue supplemental oxygen. Total time spent counseling and coordinating care .  DTat

## 2012-08-09 NOTE — Progress Notes (Signed)
  Echocardiogram 2D Echocardiogram has been performed.  Natasha Cline 08/09/2012, 9:56 AM

## 2012-08-09 NOTE — Progress Notes (Signed)
[facility] PROGRESS NOTE  Natasha Cline ZOX:[id number] DOB: Feb 02, 1916 DOA: 08/08/2012 PCP: Florentina Jenny, MD  Brief history 79 year old female with a history of Alzheimer's dementia, hypertension, depression, GERD presented with one-day history of shortness of breath. The patient resides at a nursing facility activated EMS because the patient was found to be short of breath. Because of the patient's advanced dementia, she is unable to provide any history. History is obtained from speaking with the daughter. On the daughter visited the patient 2 days prior to admission, there was no shortness of breath. There have been no reports of fevers, chills, vomiting, diarrhea, falls. Normally, the patient is wheelchair-bound requiring assistance with her activities of daily living. Daughter states that patient is able to feed herself at baseline. There has not been any recent hospitalizations or recent antibiotics. At the time of admission, the patient had a chest x-ray which revealed small bilateral pleural effusions, right greater than left with cardiomegaly. BNP at the time of admission was 2896. The patient was started on intravenous furosemide 20 mg IV every 12 hours. On arrival, the patient oxygen saturation was 91% on room air. The patient was placed on nasal cannula. Troponins have been negative x3. EKG shows ST depression V4 to V6 with LVH. Echocardiogram has been ordered. Assessment/Plan: Bilateral pleural effusions/acute CHF -Echocardiogram -Continue furosemide -Respiratory status has improved -Wean off oxygen Hypertension -Blood pressure has been labile -Continue diltiazem for now pending further workup Abnormal chest x-ray with linear opacities -CT chest to clarify chest x-ray abnormalities Alzheimer's dementia -UA with reflex to urine culture   Family Communication:   Daughter updated by phone Disposition Plan:   SNF when medically stable     Procedures/Studies: Dg Chest  2 View (if Patient Has Fever And/or Copd)  08/08/2012   *RADIOLOGY REPORT*  Clinical Data: Shortness of breath, chest pain.  CHEST - 2 VIEW  Comparison: 01/17/2011  Findings: New pleural effusions, right greater than left.  Adjacent atelectasis/consolidation at the right lung base in the right lower lobe.  Mild cardiomegaly.  Atheromatous aorta.  Linear opacities around the right hilum.  Regional bones unremarkable.  IMPRESSION:  Stable cardiomegaly with new pleural effusions, right greater than left.   Original Report Authenticated By: D. Andria Rhein, MD         Subjective: Patient was agitated requiring restraints last night. The patient is confused. She intermittently answers questions. Review of systems is limited. No reports of respiratory distress, vomiting, diarrhea.   Objective: Filed Vitals:   08/08/12 1340 08/08/12 2036 08/09/12 0500 08/09/12 0624  BP: 114/90 151/59  110/45  Pulse: 77 80  75  Temp: 98.3 F (36.8 C) 97.8 F (36.6 C)  98 F (36.7 C)  TempSrc: Axillary Oral  Axillary  Resp: 18 20  16   Height:      Weight:   49.442 kg (109 lb) 49.6 kg (109 lb 5.6 oz)  SpO2:  98%  100%    Intake/Output Summary (Last 24 hours) at 08/09/12 0905 Last data filed at 08/08/12 1840  Gross per 24 hour  Intake 504.16 ml  Output      0 ml  Net 504.16 ml   Weight change:  Exam:   General:  Pt is alert, follows commands appropriately, not in acute distress  HEENT: No icterus, No thrush,[place]/AT  Cardiovascular: RRR, S1/S2, no rubs, no gallops  Respiratory: Bibasilar crackles. No wheezes. Good air movement.   Abdomen: Soft/+BS, non tender, non distended, no guarding  Extremities: No  edema, No lymphangitis, No petechiae, No rashes, no synovitis  Data Reviewed: Basic Metabolic Panel:  Recent Labs Lab 08/08/12 0610 08/09/12 0650  NA 139 139  K 4.4 3.9  CL 103 99  CO2 27 32  GLUCOSE 85 93  BUN 20 21  CREATININE 0.59 0.87  CALCIUM 9.1 9.4   Liver Function Tests: No  results found for this basename: AST, ALT, ALKPHOS, BILITOT, PROT, ALBUMIN,  in the last 168 hours No results found for this basename: LIPASE, AMYLASE,  in the last 168 hours No results found for this basename: AMMONIA,  in the last 168 hours CBC:  Recent Labs Lab 08/08/12 0610 08/09/12 0650  WBC 10.6* 7.4  HGB 11.8* 12.6  HCT 37.8 38.1  MCV 93.3 92.7  PLT 258 256   Cardiac Enzymes:  Recent Labs Lab 08/08/12 1033 08/08/12 1600 08/08/12 2215  TROPONINI <0.30 <0.30 <0.30   BNP: No components found with this basename: POCBNP,  CBG: No results found for this basename: GLUCAP,  in the last 168 hours  Recent Results (from the past 240 hour(s))  MRSA PCR SCREENING     Status: None   Collection Time    08/08/12 10:43 AM      Result Value Range Status   MRSA by PCR NEGATIVE  NEGATIVE Final   Comment:            The GeneXpert MRSA Assay (FDA     approved for NASAL specimens     only), is one component of a     comprehensive MRSA colonization     surveillance program. It is not     intended to diagnose MRSA     infection nor to guide or     monitor treatment for     MRSA infections.     Scheduled Meds: . diltiazem  30 mg Oral BID  . enoxaparin (LOVENOX) injection  30 mg Subcutaneous Q24H  . escitalopram  20 mg Oral q morning - 10a  . feeding supplement  237 mL Oral BID BM  . furosemide  20 mg Intravenous BID  . mirtazapine  7.5 mg Oral QHS  . sodium chloride  3 mL Intravenous Q12H  . sodium chloride  3 mL Intravenous Q12H   Continuous Infusions:    Reisa Coppola, DO  [facility] Pager [phone]  If 7PM-7AM, please contact night-coverage [email or web address] Password [email or web address] 08/09/2012, 9:05 AM   LOS: 1 day

## 2012-08-09 NOTE — ED Provider Notes (Signed)
Medical screening examination/treatment/procedure(s) were conducted as a shared visit with non-physician practitioner(s) and myself.  I personally evaluated the patient during the encounter  PT with dementia, HTN, no h/o CAD or CHF, presents due to sudden CP and SOB early AM.  EMS called and pt brought to the ED.  Pt denies current CP.  ECG shows no acute ischemia, troponin is normal.  B effusions on CXR with RA sats in the low 90's and upper 80's.  Mild elevation of BNP suggestive of CHF, possibly flash edema.  Pt was initially HTN, improved with no specific intervention.  Had not yet had AM medications due to being brought to the ED.  Will admit for gentle diuresis and monitoring for improvement in saturations.  Will likely need cycle of cardiac markers as well.    Gavin Pound. Deaja Rizo, MD 08/09/12 1529

## 2012-08-09 NOTE — Progress Notes (Signed)
Clinical Social Work  Patient was discussed during progression meeting. RN reports that patient is wheelchair bound and requires assistance with all ADL. CSW made MD aware that patient is from ALF and MD had questions regarding patient's ability to return. CSW called [facility] who reports patient is in their memory care unit and they provide total care for patient. Facility is agreeable to accept patient and aware of her needs. CSW will continue to follow.  Unk Lightning, LCSW (Coverage for [employer])

## 2012-08-10 ENCOUNTER — Inpatient Hospital Stay (HOSPITAL_COMMUNITY): Payer: Medicare Other

## 2012-08-10 DIAGNOSIS — J90 Pleural effusion, not elsewhere classified: Secondary | ICD-10-CM

## 2012-08-10 DIAGNOSIS — I5031 Acute diastolic (congestive) heart failure: Principal | ICD-10-CM

## 2012-08-10 LAB — BASIC METABOLIC PANEL
BUN: 37 mg/dL — ABNORMAL HIGH (ref 6–23)
Chloride: 96 mEq/L (ref 96–112)
Creatinine, Ser: 0.8 mg/dL (ref 0.50–1.10)
GFR calc Af Amer: 71 mL/min — ABNORMAL LOW (ref >90)

## 2012-08-10 LAB — CBC
HCT: 39.6 % (ref 36.0–46.0)
Hemoglobin: 12.7 g/dL (ref 12.0–15.0)
MCH: 29.7 pg (ref 26.0–34.0)
MCHC: 32.1 g/dL (ref 30.0–36.0)

## 2012-08-10 MED ORDER — METOPROLOL SUCCINATE ER 25 MG PO TB24
25.0000 mg | ORAL_TABLET | Freq: Every day | ORAL | Status: DC
  Filled 2012-08-10: qty 1

## 2012-08-10 MED ORDER — METOPROLOL SUCCINATE ER 25 MG PO TB24: 25.0000 mg | ORAL_TABLET | Freq: Every day | ORAL | Status: DC

## 2012-08-10 MED ORDER — LORAZEPAM 0.5 MG PO TABS: 0.5000 mg | ORAL_TABLET | ORAL | Status: DC | PRN

## 2012-08-10 MED ORDER — ENOXAPARIN SODIUM 40 MG/0.4ML ~~LOC~~ SOLN
40.0000 mg | SUBCUTANEOUS | Status: DC
  Filled 2012-08-10: qty 0.4

## 2012-08-10 MED ORDER — FUROSEMIDE 40 MG PO TABS
40.0000 mg | ORAL_TABLET | Freq: Every day | ORAL | Status: DC
  Administered 2012-08-10: 40 mg via ORAL
  Filled 2012-08-10: qty 1

## 2012-08-10 MED ORDER — FUROSEMIDE 40 MG PO TABS: 40.0000 mg | ORAL_TABLET | Freq: Every day | ORAL | Status: DC

## 2012-08-10 NOTE — Progress Notes (Signed)
Pt for discharge back to [facility].  CSW facilitated pt discharge needs including contacting facility, faxing pt discharge information to facility and confirming information was received by ALF and reviewed, discussed with pt daughter at bedside, provided RN phone number to call report, and arranged ambulance transportation for pt to [facility] (Service Request ID#: [id number])  No further social work needs identified at this time.  CSW signing off.   Jacklynn Lewis, MSW, LCSWA (coverage) Clinical Social Work [phone]

## 2012-08-10 NOTE — Consult Note (Signed)
Reason for Consult:right hydropneumothorax Referring Physician: Dr. Harle Stanford Natasha Cline is an 79 y.o. female.  HPI: 79 yo woman with dementia admitted over the weekend with shortness of breath. She had a CXR which showed a right hydropneumothorax on 6/15. The effusion was noted but no mention made of pneumo component. Yesterday she had a CT of the chest which showed the right hydropneumothorax again. She and her daughter both say she feels better and is breathing more easily today.  Past Medical History  Diagnosis Date  . Alzheimer disease   . Depression   . Hypertension   . Osteoporosis   . GERD (gastroesophageal reflux disease)     Past Surgical History  Procedure Laterality Date  . Back surgery      History reviewed. No pertinent family history.  Social History:  reports that she has never smoked. She does not have any smokeless tobacco history on file. She reports that she does not drink alcohol or use illicit drugs.  Allergies:  Allergies  Allergen Reactions  . Iodine     Reaction unknown  . Shellfish Allergy     Reaction unknown    Medications:  Prior to Admission:  Prescriptions prior to admission  Medication Sig Dispense Refill  . diltiazem (CARDIZEM) 30 MG tablet Take 30 mg by mouth 2 (two) times daily. Hold if blood pressure is less than 60      . escitalopram (LEXAPRO) 20 MG tablet Take 20 mg by mouth every morning.      . feeding supplement (ENSURE IMMUNE HEALTH) LIQD Take 237 mLs by mouth 2 (two) times daily.      Marland Kitchen LORazepam (ATIVAN) 0.5 MG tablet Take 0.25 mg by mouth every 4 (four) hours as needed (agitation).      . mirtazapine (REMERON) 15 MG tablet Take 7.5 mg by mouth at bedtime.      Marland Kitchen zinc oxide 20 % ointment Apply 1 application topically 3 (three) times daily.        Results for orders placed during the hospital encounter of 08/08/12 (from the past 48 hour(s))  TROPONIN I     Status: None   Collection Time    08/08/12 10:33 AM      Result  Value Range   Troponin I <0.30  <0.30 ng/mL   Comment:            Due to the release kinetics of cTnI,     a negative result within the first hours     of the onset of symptoms does not rule out     myocardial infarction with certainty.     If myocardial infarction is still suspected,     repeat the test at appropriate intervals.  MRSA PCR SCREENING     Status: None   Collection Time    08/08/12 10:43 AM      Result Value Range   MRSA by PCR NEGATIVE  NEGATIVE   Comment:            The GeneXpert MRSA Assay (FDA     approved for NASAL specimens     only), is one component of a     comprehensive MRSA colonization     surveillance program. It is not     intended to diagnose MRSA     infection nor to guide or     monitor treatment for     MRSA infections.  TROPONIN I     Status: None  Collection Time    08/08/12  4:00 PM      Result Value Range   Troponin I <0.30  <0.30 ng/mL   Comment:            Due to the release kinetics of cTnI,     a negative result within the first hours     of the onset of symptoms does not rule out     myocardial infarction with certainty.     If myocardial infarction is still suspected,     repeat the test at appropriate intervals.  TROPONIN I     Status: None   Collection Time    08/08/12 10:15 PM      Result Value Range   Troponin I <0.30  <0.30 ng/mL   Comment:            Due to the release kinetics of cTnI,     a negative result within the first hours     of the onset of symptoms does not rule out     myocardial infarction with certainty.     If myocardial infarction is still suspected,     repeat the test at appropriate intervals.  PRO B NATRIURETIC PEPTIDE     Status: Abnormal   Collection Time    08/09/12  6:50 AM      Result Value Range   Pro B Natriuretic peptide (BNP) 6214.0 (*) 0 - 450 pg/mL  TSH     Status: None   Collection Time    08/09/12  6:50 AM      Result Value Range   TSH 1.472  0.350 - 4.500 uIU/mL  BASIC METABOLIC  PANEL     Status: Abnormal   Collection Time    08/09/12  6:50 AM      Result Value Range   Sodium 139  135 - 145 mEq/L   Potassium 3.9  3.5 - 5.1 mEq/L   Chloride 99  96 - 112 mEq/L   CO2 32  19 - 32 mEq/L   Glucose, Bld 93  70 - 99 mg/dL   BUN 21  6 - 23 mg/dL   Creatinine, Ser 1.61  0.50 - 1.10 mg/dL   Calcium 9.4  8.4 - 09.6 mg/dL   GFR calc non Af Amer 55 (*) >90 mL/min   GFR calc Af Amer 64 (*) >90 mL/min   Comment:            The eGFR has been calculated     using the CKD EPI equation.     This calculation has not been     validated in all clinical     situations.     eGFR's persistently     <90 mL/min signify     possible Chronic Kidney Disease.  CBC     Status: None   Collection Time    08/09/12  6:50 AM      Result Value Range   WBC 7.4  4.0 - 10.5 K/uL   RBC 4.11  3.87 - 5.11 MIL/uL   Hemoglobin 12.6  12.0 - 15.0 g/dL   HCT 04.5  40.9 - 81.1 %   MCV 92.7  78.0 - 100.0 fL   MCH 30.7  26.0 - 34.0 pg   MCHC 33.1  30.0 - 36.0 g/dL   RDW 91.4  78.2 - 95.6 %   Platelets 256  150 - 400 K/uL  URINALYSIS W MICROSCOPIC + REFLEX CULTURE  Status: Abnormal   Collection Time    08/09/12 10:26 AM      Result Value Range   Color, Urine YELLOW  YELLOW   APPearance CLEAR  CLEAR   Specific Gravity, Urine 1.016  1.005 - 1.030   pH 5.0  5.0 - 8.0   Glucose, UA NEGATIVE  NEGATIVE mg/dL   Hgb urine dipstick TRACE (*) NEGATIVE   Bilirubin Urine NEGATIVE  NEGATIVE   Ketones, ur NEGATIVE  NEGATIVE mg/dL   Protein, ur NEGATIVE  NEGATIVE mg/dL   Urobilinogen, UA 0.2  0.0 - 1.0 mg/dL   Nitrite POSITIVE (*) NEGATIVE   Leukocytes, UA SMALL (*) NEGATIVE   WBC, UA 3-6  <3 WBC/hpf   RBC / HPF 0-2  <3 RBC/hpf   Bacteria, UA MANY (*) RARE   Squamous Epithelial / LPF RARE  RARE   Casts HYALINE CASTS (*) NEGATIVE  BASIC METABOLIC PANEL     Status: Abnormal   Collection Time    08/10/12  5:15 AM      Result Value Range   Sodium 138  135 - 145 mEq/L   Potassium 4.4  3.5 - 5.1  mEq/L   Comment: MODERATE HEMOLYSIS     HEMOLYSIS AT THIS LEVEL MAY AFFECT RESULT   Chloride 96  96 - 112 mEq/L   CO2 30  19 - 32 mEq/L   Glucose, Bld 75  70 - 99 mg/dL   BUN 37 (*) 6 - 23 mg/dL   Comment: DELTA CHECK NOTED     REPEATED TO VERIFY   Creatinine, Ser 0.80  0.50 - 1.10 mg/dL   Calcium 9.7  8.4 - 11.9 mg/dL   GFR calc non Af Amer 61 (*) >90 mL/min   GFR calc Af Amer 71 (*) >90 mL/min   Comment:            The eGFR has been calculated     using the CKD EPI equation.     This calculation has not been     validated in all clinical     situations.     eGFR's persistently     <90 mL/min signify     possible Chronic Kidney Disease.  MAGNESIUM     Status: None   Collection Time    08/10/12  5:15 AM      Result Value Range   Magnesium 2.1  1.5 - 2.5 mg/dL  CBC     Status: None   Collection Time    08/10/12  5:15 AM      Result Value Range   WBC 9.4  4.0 - 10.5 K/uL   RBC 4.27  3.87 - 5.11 MIL/uL   Hemoglobin 12.7  12.0 - 15.0 g/dL   HCT 14.7  82.9 - 56.2 %   MCV 92.7  78.0 - 100.0 fL   MCH 29.7  26.0 - 34.0 pg   MCHC 32.1  30.0 - 36.0 g/dL   RDW 13.0  86.5 - 78.4 %   Platelets 293  150 - 400 K/uL  PROTIME-INR     Status: None   Collection Time    08/10/12  5:15 AM      Result Value Range   Prothrombin Time 13.3  11.6 - 15.2 seconds   INR 1.02  0.00 - 1.49  APTT     Status: Abnormal   Collection Time    08/10/12  5:15 AM      Result Value Range   aPTT 43 (*) 24 -  37 seconds   Comment:            IF BASELINE aPTT IS ELEVATED,     SUGGEST PATIENT RISK ASSESSMENT     BE USED TO DETERMINE APPROPRIATE     ANTICOAGULANT THERAPY.    Ct Chest Wo Contrast  08/09/2012   **ADDENDUM** CREATED: 08/09/2012 12:45:19  Findings were discussed with Dr. Arbutus Leas on 08/09/2012 at 1245 hours. The pneumothorax component was estimated at 10-15%.  **END ADDENDUM** SIGNED BY: Charline Bills, M.D.  08/09/2012   *RADIOLOGY REPORT*  Clinical Data: Abnormal chest radiograph, evaluate  pleural effusion  CT CHEST WITHOUT CONTRAST  Technique:  Multidetector CT imaging of the chest was performed following the standard protocol without IV contrast.  Comparison: Chest radiographs dated 08/08/2012  Findings: Moderate right hydropneumothorax with a small pneumothorax component anteriorly (series 5/image 39).  Associated compressive atelectasis in the right lower lobe.  The visualized thyroid is unremarkable.  The heart is normal in size.  No pericardial effusion.  Coronary atherosclerosis.  Atherosclerotic calcifications of the aortic valve, aortic arch, and mitral valve annulus.  A few scattered tiny foci of gas along the right hemidiaphragm are related to the small pneumothorax (series 2/images 45, 48, and 50). Visualized upper abdomen is otherwise notable for vascular calcifications.  Degenerative changes of the visualized thoracolumbar spine.  IMPRESSION: Moderate right hydropneumothorax with a small pneumothorax component anteriorly.  Associated compressive atelectasis in the right lower lobe.  Critical Value/emergent results were called by telephone at the time of interpretation on 08/09/2012 at 1215 hours to [place], the patient's nurse, who verbally acknowledged these results.  Original Report Authenticated By: Charline Bills, M.D.   Dg Chest Port 1 View  08/10/2012   *RADIOLOGY REPORT*  Clinical Data: Follow up hydropneumothorax  PORTABLE CHEST - 1 VIEW  Comparison: 08/08/2012  Findings: Cardiac shadow is stable.  The lungs are well-aerated bilaterally.  Mild interstitial changes are again noted.  There are persistent changes in the right base although aeration is improved. No definitive pneumothorax is noted on this examination.  IMPRESSION: Persistent right basilar changes.  No pneumothorax is noted on this exam.   Original Report Authenticated By: Alcide Clever, M.D.    Review of Systems  Unable to perform ROS: dementia   Blood pressure 139/62, pulse 88, temperature 97.4 F (36.3 C),  temperature source Oral, resp. rate 24, height 5\' 1"  (1.549 m), weight 109 lb 5.6 oz (49.6 kg), SpO2 99.00%. Physical Exam  Vitals reviewed. Constitutional:  Thin, frail  Eyes: Pupils are equal, round, and reactive to light.  Cardiovascular: Normal rate and regular rhythm.   Murmur heard. Respiratory:  Diminished BS right base  GI: Soft. There is no tenderness.  Lymphadenopathy:    She has no cervical adenopathy.  Neurological: She is alert.  Skin: Skin is warm and dry.    Assessment/Plan: 79 yo woman with dementia presented with SOB. She has a right hydropneumothorax by CT and also has CHF with a BNP > 6000. Her symptoms have improved with medical therapy. Interestingly her CXR today still shows less of a pneumo than her original one did. This may be due in part to technique/ positioning. She does still have a right effusion.  Currently she is minimally symptomatic as far as I can tell.  Options include:  1. Thoracentesis  2. Pig tail catheter placed under CT or US guidance  3. Observation and continued medical therapy  No indication for a surgically placed Ct at this time.  Katelind Pytel C 08/10/2012, 9:28 AM

## 2012-08-10 NOTE — Discharge Summary (Addendum)
Physician Discharge Summary  Natasha Cline ZOX:[id number] DOB: April 24, 1915 DOA: 08/08/2012  PCP: Florentina Jenny, MD  Admit date: 08/08/2012 Discharge date: 08/10/2012  Recommendations for Outpatient Follow-up:  1. Pt will need to follow up with PCP in 1 week post discharge 2. Please obtain BMP to evaluate electrolytes and kidney function on 08/13/12 3.   Discharge Diagnoses:  Principal Problem:   SOB (shortness of breath) Active Problems:   Alzheimer's disease   Pleural effusion   Elevated brain natriuretic peptide (BNP) level   Hydropneumothorax Bilateral pleural effusions/acute diastolic CHF  -Echocardiogram EF 55-60%, critical aortic stenosis -Continue furosemide--changed to 40mg  po daily -Respiratory status has improved  -Weaned off oxygen without sob -Patient will need BMP checked on Friday, 08/13/2012--> further furosemide dosing will be adjusted by the patient's outpatient physician based upon the patient's electrolytes and renal function. Hydropneumothorax -Dr. Dorris Fetch was consulted -He did not recommend any further invasive intervention as the patient's clinical status was stable and improved -In addition, repeat chest x-ray revealed resolution of the patient's pneumothorax Critical aortic stenosis -This was discussed with the patient's family whom did not wish to pursue any further workup Hypertension  -Blood pressure has been labile but stable 120-140 systolic -Given the new diagnosis of acute diastolic CHF, discontinue diltiazem -Start metoprolol succinate 25 mg daily Alzheimer's dementia  -urine culture grew Escherichia coli, but the patient did not have any significant pyuria -In addition the patient's mentation was at baseline -Patient will not be treated with any antibiotics at this time History of present illness:  79 year old female with a history of Alzheimer's dementia, hypertension, depression, GERD presented with one-day history of shortness of breath.  The patient resides at a nursing facility activated EMS because the patient was found to be short of breath. Because of the patient's advanced dementia, she is unable to provide any history. History is obtained from speaking with the daughter. On the daughter visited the patient 2 days prior to admission, there was no shortness of breath. There have been no reports of fevers, chills, vomiting, diarrhea, falls. Normally, the patient is wheelchair-bound requiring assistance with her activities of daily living. Daughter states that patient is able to feed herself at baseline. There has not been any recent hospitalizations or recent antibiotics. At the time of admission, the patient had a chest x-ray which revealed small bilateral pleural effusions, right greater than left with cardiomegaly. BNP at the time of admission was 2896. The patient was started on intravenous furosemide 20 mg IV every 12 hours. On arrival, the patient oxygen saturation was 91% on room air. The patient's respiratory status improved with IV furosemide. The patient's serum creatinine increased from 0.59 to 0.87. As a result, her furosemide was changed to 40 mg oral daily which she tolerated. The patient was placed on nasal cannula. The patient was subsequently weaned off oxygen and remained clinically stable. Troponins have been negative x3. EKG shows ST depression V4 to V6 with LVH. Echocardiogram showed EF 55-60% with critical aortic stenosis. After discussion with the family they did not want to pursue any further workup. CT of the chest revealed right-sided hydropneumothorax. The patient was clinically stable without any further hypoxemia. Dr. Dorris Fetch was consulted to see the patient. Repeated chest x-ray revealed resolution of the patient's pneumothorax. Therefore, no further aggressive intervention was recommended by Dr. Dorris Fetch in the family agreed.   Discharge Condition: stable  Disposition: SNF  Diet:regular Wt Readings  from Last 3 Encounters:  08/09/12 49.6 kg (109 lb  5.6 oz)  03/13/10 55.339 kg (122 lb)       Consultants: Cardiothoracic surgery, Dr. Dorris Fetch  Discharge Exam: Filed Vitals:   08/10/12 0632  BP: 139/62  Pulse: 88  Temp: 97.4 F (36.3 C)  Resp: 24   Filed Vitals:   08/09/12 1440 08/09/12 2118 08/10/12 0632 08/10/12 1144  BP: 121/47 126/107 139/62   Pulse: 65 95 88   Temp: 98 F (36.7 C) 98.7 F (37.1 C) 97.4 F (36.3 C)   TempSrc: Axillary Axillary Oral   Resp: 17 20 24    Height:      Weight:      SpO2: 97% 95% 99% 92%   General: A&O x 2, NAD, pleasant, cooperative Cardiovascular: RRR, no rub, no gallop, no S3 Respiratory: Bibasilar crackles without any wheezing. Good air movement. Abdomen:soft, nontender, nondistended, positive bowel sounds Extremities: No edema, No lymphangitis, no petechiae  Discharge Instructions      Discharge Orders   Future Orders Complete By Expires     Diet - low sodium heart healthy  As directed     Discharge instructions  As directed     Comments:      Start taking metoprolol succinate 25 mg daily in morning of 08/11/12 Basic metabolic panel on 08/13/12    Increase activity slowly  As directed         Medication List    STOP taking these medications       diltiazem 30 MG tablet  Commonly known as:  CARDIZEM      TAKE these medications       escitalopram 20 MG tablet  Commonly known as:  LEXAPRO  Take 20 mg by mouth every morning.     feeding supplement Liqd  Take 237 mLs by mouth 2 (two) times daily.     furosemide 40 MG tablet  Commonly known as:  LASIX  Take 1 tablet (40 mg total) by mouth daily.     LORazepam 0.5 MG tablet  Commonly known as:  ATIVAN  Take 0.25 mg by mouth every 4 (four) hours as needed (agitation).     metoprolol succinate 25 MG 24 hr tablet  Commonly known as:  TOPROL-XL  Take 1 tablet (25 mg total) by mouth daily.     mirtazapine 15 MG tablet  Commonly known as:  REMERON  Take 7.5  mg by mouth at bedtime.     zinc oxide 20 % ointment  Apply 1 application topically 3 (three) times daily.         The results of significant diagnostics from this hospitalization (including imaging, microbiology, ancillary and laboratory) are listed below for reference.    Significant Diagnostic Studies: Dg Chest 2 View (if Patient Has Fever And/or Copd)  08/10/2012   **ADDENDUM** CREATED: 08/10/2012 08:35:34  Upon further review, a right hydropneumothorax is evident.  **END ADDENDUM** SIGNED BY: D. Oley Balm III, MD  08/08/2012   *RADIOLOGY REPORT*  Clinical Data: Shortness of breath, chest pain.  CHEST - 2 VIEW  Comparison: 01/17/2011  Findings: New pleural effusions, right greater than left.  Adjacent atelectasis/consolidation at the right lung base in the right lower lobe.  Mild cardiomegaly.  Atheromatous aorta.  Linear opacities around the right hilum.  Regional bones unremarkable.  IMPRESSION:  Stable cardiomegaly with new pleural effusions, right greater than left.   Original Report Authenticated By: D. Andria Rhein, MD   Ct Chest Wo Contrast  08/09/2012   **ADDENDUM** CREATED: 08/09/2012 12:45:19  Findings were discussed  with Dr. Arbutus Leas on 08/09/2012 at 1245 hours. The pneumothorax component was estimated at 10-15%.  **END ADDENDUM** SIGNED BY: Charline Bills, M.D.  08/09/2012   *RADIOLOGY REPORT*  Clinical Data: Abnormal chest radiograph, evaluate pleural effusion  CT CHEST WITHOUT CONTRAST  Technique:  Multidetector CT imaging of the chest was performed following the standard protocol without IV contrast.  Comparison: Chest radiographs dated 08/08/2012  Findings: Moderate right hydropneumothorax with a small pneumothorax component anteriorly (series 5/image 39).  Associated compressive atelectasis in the right lower lobe.  The visualized thyroid is unremarkable.  The heart is normal in size.  No pericardial effusion.  Coronary atherosclerosis.  Atherosclerotic calcifications of the  aortic valve, aortic arch, and mitral valve annulus.  A few scattered tiny foci of gas along the right hemidiaphragm are related to the small pneumothorax (series 2/images 45, 48, and 50). Visualized upper abdomen is otherwise notable for vascular calcifications.  Degenerative changes of the visualized thoracolumbar spine.  IMPRESSION: Moderate right hydropneumothorax with a small pneumothorax component anteriorly.  Associated compressive atelectasis in the right lower lobe.  Critical Value/emergent results were called by telephone at the time of interpretation on 08/09/2012 at 1215 hours to [place], the patient's nurse, who verbally acknowledged these results.  Original Report Authenticated By: Charline Bills, M.D.   Dg Chest Port 1 View  08/10/2012   *RADIOLOGY REPORT*  Clinical Data: Follow up hydropneumothorax  PORTABLE CHEST - 1 VIEW  Comparison: 08/08/2012  Findings: Cardiac shadow is stable.  The lungs are well-aerated bilaterally.  Mild interstitial changes are again noted.  There are persistent changes in the right base although aeration is improved. No definitive pneumothorax is noted on this examination.  IMPRESSION: Persistent right basilar changes.  No pneumothorax is noted on this exam.   Original Report Authenticated By: Alcide Clever, M.D.     Microbiology: Recent Results (from the past 240 hour(s))  MRSA PCR SCREENING     Status: None   Collection Time    08/08/12 10:43 AM      Result Value Range Status   MRSA by PCR NEGATIVE  NEGATIVE Final   Comment:            The GeneXpert MRSA Assay (FDA     approved for NASAL specimens     only), is one component of a     comprehensive MRSA colonization     surveillance program. It is not     intended to diagnose MRSA     infection nor to guide or     monitor treatment for     MRSA infections.  URINE CULTURE     Status: None   Collection Time    08/09/12 10:26 AM      Result Value Range Status   Specimen Description URINE, CLEAN CATCH    Final   Special Requests NONE   Final   Culture  Setup Time 08/09/2012 14:33   Final   Colony Count >=100,000 COLONIES/ML   Final   Culture ESCHERICHIA COLI   Final   Report Status PENDING   Incomplete     Labs: Basic Metabolic Panel:  Recent Labs Lab 08/08/12 0610 08/09/12 0650 08/10/12 0515  NA 139 139 138  K 4.4 3.9 4.4  CL 103 99 96  CO2 27 32 30  GLUCOSE 85 93 75  BUN 20 21 37*  CREATININE 0.59 0.87 0.80  CALCIUM 9.1 9.4 9.7  MG  --   --  2.1   Liver Function  Tests: No results found for this basename: AST, ALT, ALKPHOS, BILITOT, PROT, ALBUMIN,  in the last 168 hours No results found for this basename: LIPASE, AMYLASE,  in the last 168 hours No results found for this basename: AMMONIA,  in the last 168 hours CBC:  Recent Labs Lab 08/08/12 0610 08/09/12 0650 08/10/12 0515  WBC 10.6* 7.4 9.4  HGB 11.8* 12.6 12.7  HCT 37.8 38.1 39.6  MCV 93.3 92.7 92.7  PLT 258 256 293   Cardiac Enzymes:  Recent Labs Lab 08/08/12 1033 08/08/12 1600 08/08/12 2215  TROPONINI <0.30 <0.30 <0.30   BNP: No components found with this basename: POCBNP,  CBG: No results found for this basename: GLUCAP,  in the last 168 hours  Time coordinating discharge:  Greater than 30 minutes  Signed:  Giorgio Chabot, DO [facility] Pager: [phone] 08/10/2012, 1:18 PM

## 2012-08-10 NOTE — Clinical Documentation Improvement (Signed)
CHF DOCUMENTATION CLARIFICATION QUERY  THIS DOCUMENT IS NOT A PERMANENT PART OF THE MEDICAL RECORD  TO RESPOND TO THE THIS QUERY, FOLLOW THE INSTRUCTIONS BELOW:  1. If needed, update documentation for the patient's encounter via the notes activity.  2. Access this query again and click edit on the In [employer].  3. After updating, or not, click F2 to complete all highlighted (required) fields concerning your review. Select "additional documentation in the medical record" OR "no additional documentation provided".  4. Click Sign note button.  5. The deficiency will fall out of your In Basket *Please let us know if you are not able to complete this workflow by phone or e-mail (listed below).  Please update your documentation within the medical record to reflect your response to this query.                                                                                    08/10/12  Dear [facility],  In a better effort to capture your patient's severity of illness, reflect appropriate length of stay and utilization of resources, a review of the patient medical record has revealed the following indicators the diagnosis of Heart Failure.    Based on your clinical judgment, please clarify and document in a progress note and/or discharge summary the clinical condition associated with the following supporting information:  In responding to this query please exercise your independent judgment.  The fact that a query is asked, does not imply that any particular answer is desired or expected.  Possible Clinical Conditions?   Acute Systolic Congestive Heart Failure Acute Diastolic Congestive Heart Failure Acute Systolic & Diastolic Congestive Heart Failure Acute on Chronic Systolic Congestive Heart Failure Acute on Chronic Diastolic Congestive Heart Failure Acute on Chronic Systolic & Diastolic  Congestive Heart Failure Chronic Systolic Congestive Heart Failure Chronic Diastolic  Congestive Heart Failure Chronic Systolic & Diastolic Congestive Heart Failure Other Condition Cannot Clinically Determine  Supporting Information:  Risk Factors:(As per notes)  " Pt has Acute CHF with Pleural Effusions  Signs & Symptoms:(As per notes) 'SOB"  Diagnostics: Echo 1=16=14: Study Conclusions - Left ventricle: The cavity size was normal. There was severe concentric hypertrophy. Systolic function was normal. The estimated ejection fraction was in the range of 55% to 60%. Wall motion was normal; there were no regional wall motion abnormalities. - Aortic valve: There was critical stenosis. Valve area: 0.4cm 2(VTI). Valve area: 0.46cm 2 (Vmax). - Mitral valve: Calcified annulus. Mild regurgitation. Valve area by continuity equation (using LVOT flow): 0.96cm 2. Transthoracic echocardiography. M-mode, complete 2D, spectral Doppler, and color Doppler. Height: Height: 154.9cm. Height: 61in. Weight: Weight: 49.4kg. Weight: 108.8lb. Body mass index: BMI: 20.6kg/m 2. Body surface area: BSA: 1.60m 2. Blood pressure: 110/45. Patient    Treatment;(As per notes) -Continue furosemide -Respiratory status has improved -Wean off oxygen   Reviewed: answered in d/c summary  Thank You,  Nevin Bloodgood RN, BSN, CCDS  Clinical Documentation Specialist: [phone] Pager Health Information Management [place]

## 2012-08-11 LAB — URINE CULTURE: Colony Count: >=100000

## 2012-09-20 ENCOUNTER — Emergency Department (HOSPITAL_COMMUNITY): Payer: Medicare Other

## 2012-09-20 ENCOUNTER — Emergency Department (HOSPITAL_COMMUNITY)
Admission: EM | Admit: 2012-09-20 | Discharge: 2012-09-20 | Disposition: A | Discharge status: Home / Self Care | Payer: Medicare Other | Attending: Emergency Medicine | Admitting: Emergency Medicine

## 2012-09-20 ENCOUNTER — Encounter (HOSPITAL_COMMUNITY): Payer: Self-pay | Admitting: Emergency Medicine

## 2012-09-20 DIAGNOSIS — F329 Major depressive disorder, single episode, unspecified: Secondary | ICD-10-CM | POA: Insufficient documentation

## 2012-09-20 DIAGNOSIS — Z79899 Other long term (current) drug therapy: Secondary | ICD-10-CM | POA: Insufficient documentation

## 2012-09-20 DIAGNOSIS — S0285XA Fracture of orbit, unspecified, initial encounter for closed fracture: Secondary | ICD-10-CM

## 2012-09-20 DIAGNOSIS — W19XXXA Unspecified fall, initial encounter: Secondary | ICD-10-CM | POA: Diagnosis not present

## 2012-09-20 DIAGNOSIS — F0280 Dementia in other diseases classified elsewhere without behavioral disturbance: Secondary | ICD-10-CM | POA: Insufficient documentation

## 2012-09-20 DIAGNOSIS — Z8739 Personal history of other diseases of the musculoskeletal system and connective tissue: Secondary | ICD-10-CM | POA: Diagnosis not present

## 2012-09-20 DIAGNOSIS — I10 Essential (primary) hypertension: Secondary | ICD-10-CM | POA: Diagnosis not present

## 2012-09-20 DIAGNOSIS — G309 Alzheimer's disease, unspecified: Secondary | ICD-10-CM | POA: Insufficient documentation

## 2012-09-20 DIAGNOSIS — S02401A Maxillary fracture, unspecified, initial encounter for closed fracture: Secondary | ICD-10-CM | POA: Insufficient documentation

## 2012-09-20 DIAGNOSIS — S0280XA Fracture of other specified skull and facial bones, unspecified side, initial encounter for closed fracture: Secondary | ICD-10-CM | POA: Insufficient documentation

## 2012-09-20 DIAGNOSIS — Y929 Unspecified place or not applicable: Secondary | ICD-10-CM | POA: Insufficient documentation

## 2012-09-20 DIAGNOSIS — Y9389 Activity, other specified: Secondary | ICD-10-CM | POA: Insufficient documentation

## 2012-09-20 DIAGNOSIS — S02402A Zygomatic fracture, unspecified, initial encounter for closed fracture: Secondary | ICD-10-CM

## 2012-09-20 DIAGNOSIS — S199XXA Unspecified injury of neck, initial encounter: Secondary | ICD-10-CM | POA: Diagnosis present

## 2012-09-20 DIAGNOSIS — Z8719 Personal history of other diseases of the digestive system: Secondary | ICD-10-CM | POA: Insufficient documentation

## 2012-09-20 DIAGNOSIS — S02400A Malar fracture unspecified, initial encounter for closed fracture: Secondary | ICD-10-CM | POA: Insufficient documentation

## 2012-09-20 DIAGNOSIS — F3289 Other specified depressive episodes: Secondary | ICD-10-CM | POA: Insufficient documentation

## 2012-09-20 MED ORDER — HYDROCODONE-ACETAMINOPHEN 5-325 MG PO TABS
1.0000 | ORAL_TABLET | Freq: Once | ORAL | Status: AC
  Administered 2012-09-20: 1 via ORAL
  Filled 2012-09-20: qty 1

## 2012-09-20 MED ORDER — HYDROCODONE-ACETAMINOPHEN 5-325 MG PO TABS: 1.0000 | ORAL_TABLET | Freq: Four times a day (QID) | ORAL | Status: AC | PRN

## 2012-09-20 MED ORDER — CEPHALEXIN 500 MG PO CAPS: 500.0000 mg | ORAL_CAPSULE | Freq: Four times a day (QID) | ORAL | Status: DC

## 2012-09-20 NOTE — ED Notes (Signed)
Per EMS: Pt from [place] at [facility]. Pt got up to walk, fell to left side. No LOC. Abrasion to left face. Left side face pain. Pt has Alzheimer's. Pt ambulatory at baseline.

## 2012-09-20 NOTE — ED Provider Notes (Signed)
CSN: [id number]     Arrival date & time 09/20/12  1828 History     First MD Initiated Contact with Patient 09/20/12 1843     Chief Complaint  Patient presents with  . Fall  . Facial Pain   (Consider location/radiation/quality/duration/timing/severity/associated sxs/prior Treatment) HPI  Patient presents to the emergency department via EMS from [facility] for fall.  Per EMS notes the patient got up to walk and fell onto the left side of her face.  She apparently did not lose consciousness however complained of left-sided face pain.  She was ambulatory at the scene.  The patient has a history of Alzheimer's dementia and is a little 5 caveat due to this.  Past Medical History  Diagnosis Date  . Alzheimer disease   . Depression   . Hypertension   . Osteoporosis   . GERD (gastroesophageal reflux disease)    Past Surgical History  Procedure Laterality Date  . Back surgery     No family history on file. History  Substance Use Topics  . Smoking status: Never Smoker   . Smokeless tobacco: Never Used  . Alcohol Use: No   OB History   Grav Para Term Preterm Abortions TAB SAB Ect Mult Living                 Review of Systems  Unable to perform ROS    Allergies  Iodine and Shellfish allergy  Home Medications   Current Outpatient Rx  Name  Route  Sig  Dispense  Refill  . escitalopram (LEXAPRO) 20 MG tablet   Oral   Take 20 mg by mouth every morning.         . feeding supplement (ENSURE IMMUNE HEALTH) LIQD   Oral   Take 237 mLs by mouth 2 (two) times daily.         . furosemide (LASIX) 40 MG tablet   Oral   Take 1 tablet (40 mg total) by mouth daily.   30 tablet   0   . LORazepam (ATIVAN) 0.5 MG tablet   Oral   Take 0.25 mg by mouth every 4 (four) hours as needed (agitation).         . LORazepam (ATIVAN) 0.5 MG tablet   Oral   Take 1 tablet (0.5 mg total) by mouth every 4 (four) hours as needed (agitation).   30 tablet   0   . metoprolol succinate (TOPROL-XL) 25 MG 24 hr tablet   Oral   Take 1 tablet (25 mg total) by mouth daily.   30 tablet   0   . mirtazapine (REMERON) 15 MG tablet   Oral   Take 7.5 mg by mouth at bedtime.         Marland Kitchen zinc oxide 20 % ointment   Topical   Apply 1 application topically 3 (three) times daily.          BP 182/72  Pulse 69  Temp(Src) 98.5 F (36.9 C) (Oral)  Resp 20  SpO2 96% Physical Exam  Nursing note and vitals reviewed. Constitutional:  Thin elderly female in nad   HENT:  Head: Normocephalic and atraumatic.  No teeth fractures.  Dentition in place.  There is tenderness to palpation over the left temporal with some abrasion and mild ecchymosis.  She is tender to palpation over the left zygoma there feels to be some step-off laterally.  Opens and closes his jaw without pain.   Eyes: Conjunctivae are normal.  Pupils are equal, round, and reactive to light. No scleral icterus.  Patient does not follow commands well however she does open and move her eyes in response to questioning.  EOM appear intact she does not appear to have pain with movement of the eye suggestive of entrapment  Neck: Normal range of motion.  Cardiovascular: Normal rate, regular rhythm and normal heart sounds.  Exam reveals no gallop and no friction rub.   No murmur heard. Pulmonary/Chest: Effort normal and breath sounds normal. No respiratory distress.  Abdominal: Soft. Bowel sounds are normal. She exhibits no distension and no mass. There is no tenderness. There is no guarding.  Musculoskeletal: Normal range of motion. She exhibits no edema and no tenderness.  Patient moves arms, elbows and wrists without pain.  She has no pain to palpation of the hips, knees or ankles.  She has no pain with flexion and are internal and external rotation of the hip ear  Neurological: She is alert.  Skin: Skin is warm and dry.    ED Course   Procedures (including critical care time)  Labs Reviewed  - No data to display No results found. 1. Orbital fracture, closed, initial encounter   2. Maxillary sinus fracture, closed, initial encounter   3. Zygoma fracture, closed, initial encounter     MDM  Elderly demented patient with fall.  I have suspicion for fracture of the zygoma.  Patient will get CT head neck and face.  CT shows fracture and of orbit. maxill and zygoma.  As patient is cared for at facility I believe she can be discharged to OP follow up with ENT.  I have discussed the patient with Dr. Rhunette Croft who is in agreement with plan of care. Patient will be discharged with abx for maxillary bleed and pain meds. The patient appears reasonably screened and/or stabilized for discharge and I doubt any other medical condition or other [facility] requiring further screening, evaluation, or treatment in the ED at this time prior to discharge.   Arthor Captain, PA-C 09/23/12 1646  Arthor Captain, PA-C 09/23/12 [phone]

## 2012-09-20 NOTE — ED Notes (Signed)
Per EMS: Pt also c/o left shoulder pain. Pt is DNR.

## 2012-09-20 NOTE — Progress Notes (Signed)
CSW met with pt at bedside.  She could not confirm that she was a resident of the [facility] at [facility].  CSW confirmed that she was from the [facility] per chart.  No family was available at pts bedside.  Karle Plumber  [phone]  09/20/12 6:53 pm

## 2012-09-28 NOTE — ED Provider Notes (Signed)
Medical screening examination/treatment/procedure(s) were conducted as a shared visit with non-physician practitioner(s) and myself.  I personally evaluated the patient during the encounter  Derwood Kaplan, MD 09/28/12 [phone]

## 2012-09-29 ENCOUNTER — Other Ambulatory Visit: Payer: Self-pay

## 2012-10-01 ENCOUNTER — Encounter (HOSPITAL_COMMUNITY): Payer: Self-pay | Admitting: Emergency Medicine

## 2012-10-01 ENCOUNTER — Emergency Department (HOSPITAL_COMMUNITY): Payer: Medicare Other

## 2012-10-01 ENCOUNTER — Emergency Department (HOSPITAL_COMMUNITY)
Admission: EM | Admit: 2012-10-01 | Discharge: 2012-10-01 | Disposition: A | Discharge status: Skilled Nursing Facility | Payer: Medicare Other | Attending: Emergency Medicine | Admitting: Emergency Medicine

## 2012-10-01 DIAGNOSIS — Z8719 Personal history of other diseases of the digestive system: Secondary | ICD-10-CM | POA: Insufficient documentation

## 2012-10-01 DIAGNOSIS — S0083XA Contusion of other part of head, initial encounter: Secondary | ICD-10-CM

## 2012-10-01 DIAGNOSIS — S0003XA Contusion of scalp, initial encounter: Secondary | ICD-10-CM | POA: Insufficient documentation

## 2012-10-01 DIAGNOSIS — F329 Major depressive disorder, single episode, unspecified: Secondary | ICD-10-CM | POA: Insufficient documentation

## 2012-10-01 DIAGNOSIS — S1093XA Contusion of unspecified part of neck, initial encounter: Secondary | ICD-10-CM | POA: Insufficient documentation

## 2012-10-01 DIAGNOSIS — Z79899 Other long term (current) drug therapy: Secondary | ICD-10-CM | POA: Insufficient documentation

## 2012-10-01 DIAGNOSIS — R296 Repeated falls: Secondary | ICD-10-CM | POA: Insufficient documentation

## 2012-10-01 DIAGNOSIS — Y9210 Unspecified residential institution as the place of occurrence of the external cause: Secondary | ICD-10-CM | POA: Insufficient documentation

## 2012-10-01 DIAGNOSIS — Y939 Activity, unspecified: Secondary | ICD-10-CM | POA: Insufficient documentation

## 2012-10-01 DIAGNOSIS — F3289 Other specified depressive episodes: Secondary | ICD-10-CM | POA: Insufficient documentation

## 2012-10-01 DIAGNOSIS — F0280 Dementia in other diseases classified elsewhere without behavioral disturbance: Secondary | ICD-10-CM | POA: Insufficient documentation

## 2012-10-01 DIAGNOSIS — I10 Essential (primary) hypertension: Secondary | ICD-10-CM | POA: Insufficient documentation

## 2012-10-01 DIAGNOSIS — Z8739 Personal history of other diseases of the musculoskeletal system and connective tissue: Secondary | ICD-10-CM | POA: Insufficient documentation

## 2012-10-01 DIAGNOSIS — G309 Alzheimer's disease, unspecified: Secondary | ICD-10-CM | POA: Insufficient documentation

## 2012-10-01 NOTE — ED Notes (Signed)
Patient is from [facility].  Patient had and un witnessed fall, they were doing rounds and found her on the floor with a hematoma to her left eye. Decreased ROM to left arm due to pain on palpation.  Patient has bruising noted to jaw from a past fall.

## 2012-10-01 NOTE — ED Notes (Signed)
Patient is resting comfortably. Upon arrival the patient complained of pain to her right wrist when touched. At this time the patient is resting and denies any pain to that wrist.  Patient has bilateral knee abrasions  - bleeding controlled.   Has a hematoma and abrasion noted to her left forehead - bleeding controlled.

## 2012-10-01 NOTE — ED Provider Notes (Signed)
CSN: [id number]     Arrival date & time 10/01/12  1805 History     First MD Initiated Contact with Patient 10/01/12 1805     Chief Complaint  Patient presents with  . Fall  . Head Injury   (Consider location/radiation/quality/duration/timing/severity/associated sxs/prior Treatment) Patient is a 79 y.o. female presenting with fall and head injury. The history is provided by the patient, the nursing home and the EMS personnel. The history is limited by the condition of the patient.  Fall  Head Injury  patient with history of dementia and was found on the floor the nursing home after an unwitnessed fall. Bruising and swelling noted above the left eye. Patient said her baseline otherwise. Patient possibly has left wrist pain without evidence of deformity or bruising. EMS was called and patient transported here on no backboard or in C-spine precautions.  Past Medical History  Diagnosis Date  . Alzheimer disease   . Depression   . Hypertension   . Osteoporosis   . GERD (gastroesophageal reflux disease)    Past Surgical History  Procedure Laterality Date  . Back surgery     History reviewed. No pertinent family history. History  Substance Use Topics  . Smoking status: Never Smoker   . Smokeless tobacco: Never Used  . Alcohol Use: No   OB History   Grav Para Term Preterm Abortions TAB SAB Ect Mult Living                 Review of Systems  Unable to perform ROS   Allergies  Iodine and Shellfish allergy  Home Medications   Current Outpatient Rx  Name  Route  Sig  Dispense  Refill  . escitalopram (LEXAPRO) 20 MG tablet   Oral   Take 20 mg by mouth every morning.         . feeding supplement (ENSURE IMMUNE HEALTH) LIQD   Oral   Take 237 mLs by mouth 2 (two) times daily.         . furosemide (LASIX) 40 MG tablet   Oral   Take 1 tablet (40 mg total) by mouth daily.   30 tablet   0   . metoprolol succinate (TOPROL-XL) 25 MG 24 hr tablet   Oral   Take 1 tablet  (25 mg total) by mouth daily.   30 tablet   0   . mirtazapine (REMERON) 15 MG tablet   Oral   Take 7.5 mg by mouth at bedtime.         . cephALEXin (KEFLEX) 500 MG capsule   Oral   Take 1 capsule (500 mg total) by mouth 4 (four) times daily.   40 capsule   0   . HYDROcodone-acetaminophen (NORCO) 5-325 MG per tablet   Oral   Take 1 tablet by mouth every 6 (six) hours as needed for pain.   10 tablet   0   . LORazepam (ATIVAN) 0.5 MG tablet   Oral   Take 0.25 mg by mouth every 4 (four) hours as needed (agitation).         . zinc oxide 20 % ointment   Topical   Apply 1 application topically 3 (three) times daily.          BP 178/69  Pulse 78  Temp(Src) 98.4 F (36.9 C) (Oral)  Resp 18  SpO2 96% Physical Exam  Nursing note and vitals reviewed. Constitutional: She is oriented to person, place, and time. She appears well-developed and well-nourished.  Non-toxic appearance. No distress.  HENT:  Head: Normocephalic and atraumatic.    Eyes: Conjunctivae, EOM and lids are normal. Pupils are equal, round, and reactive to light.  Neck: Normal range of motion. Neck supple. No tracheal deviation present. No mass present.  Cardiovascular: Normal rate, regular rhythm and normal heart sounds.  Exam reveals no gallop.   No murmur heard. Pulmonary/Chest: Effort normal and breath sounds normal. No stridor. No respiratory distress. She has no decreased breath sounds. She has no wheezes. She has no rhonchi. She has no rales.  Abdominal: Soft. Normal appearance and bowel sounds are normal. She exhibits no distension. There is no tenderness. There is no rebound and no CVA tenderness.  Musculoskeletal: Normal range of motion. She exhibits no edema and no tenderness.  Neurological: She is alert and oriented to person, place, and time. She has normal strength. No cranial nerve deficit or sensory deficit. GCS eye subscore is 4. GCS verbal subscore is 5. GCS motor subscore is 6.  Skin: Skin  is warm and dry. No abrasion and no rash noted.  Psychiatric: She has a normal mood and affect. Her speech is normal and behavior is normal.    ED Course   Procedures (including critical care time)  Labs Reviewed - No data to display No results found. No diagnosis found.  MDM  Work appears been negative including CT of the head and neck. Patient with facial contusion and will be discharged home  Toy Baker, MD 10/01/12 2026

## 2012-10-01 NOTE — ED Notes (Signed)
[id number]<AV> Expected date:<BR> Expected time:<BR> Means of arrival:<BR> Comments:<BR> EMS-79y/o fall

## 2012-12-30 ENCOUNTER — Other Ambulatory Visit: Payer: Self-pay

## 2013-01-15 ENCOUNTER — Emergency Department (HOSPITAL_COMMUNITY)
Admission: EM | Admit: 2013-01-15 | Discharge: 2013-01-15 | Disposition: A | Discharge status: Home / Self Care | Payer: Medicare Other | Attending: Emergency Medicine | Admitting: Emergency Medicine

## 2013-01-15 ENCOUNTER — Encounter (HOSPITAL_COMMUNITY): Payer: Self-pay | Admitting: Emergency Medicine

## 2013-01-15 ENCOUNTER — Emergency Department (HOSPITAL_COMMUNITY): Payer: Medicare Other

## 2013-01-15 DIAGNOSIS — Z792 Long term (current) use of antibiotics: Secondary | ICD-10-CM | POA: Insufficient documentation

## 2013-01-15 DIAGNOSIS — Z8739 Personal history of other diseases of the musculoskeletal system and connective tissue: Secondary | ICD-10-CM | POA: Insufficient documentation

## 2013-01-15 DIAGNOSIS — F3289 Other specified depressive episodes: Secondary | ICD-10-CM | POA: Insufficient documentation

## 2013-01-15 DIAGNOSIS — F0390 Unspecified dementia without behavioral disturbance: Secondary | ICD-10-CM

## 2013-01-15 DIAGNOSIS — Y939 Activity, unspecified: Secondary | ICD-10-CM | POA: Insufficient documentation

## 2013-01-15 DIAGNOSIS — F0280 Dementia in other diseases classified elsewhere without behavioral disturbance: Secondary | ICD-10-CM | POA: Insufficient documentation

## 2013-01-15 DIAGNOSIS — F329 Major depressive disorder, single episode, unspecified: Secondary | ICD-10-CM | POA: Insufficient documentation

## 2013-01-15 DIAGNOSIS — Z79899 Other long term (current) drug therapy: Secondary | ICD-10-CM | POA: Insufficient documentation

## 2013-01-15 DIAGNOSIS — S8002XA Contusion of left knee, initial encounter: Secondary | ICD-10-CM

## 2013-01-15 DIAGNOSIS — Y9210 Unspecified residential institution as the place of occurrence of the external cause: Secondary | ICD-10-CM | POA: Insufficient documentation

## 2013-01-15 DIAGNOSIS — W19XXXA Unspecified fall, initial encounter: Secondary | ICD-10-CM

## 2013-01-15 DIAGNOSIS — S8000XA Contusion of unspecified knee, initial encounter: Secondary | ICD-10-CM | POA: Insufficient documentation

## 2013-01-15 DIAGNOSIS — R296 Repeated falls: Secondary | ICD-10-CM | POA: Insufficient documentation

## 2013-01-15 DIAGNOSIS — Z8719 Personal history of other diseases of the digestive system: Secondary | ICD-10-CM | POA: Insufficient documentation

## 2013-01-15 DIAGNOSIS — S79919A Unspecified injury of unspecified hip, initial encounter: Secondary | ICD-10-CM | POA: Insufficient documentation

## 2013-01-15 DIAGNOSIS — G309 Alzheimer's disease, unspecified: Secondary | ICD-10-CM | POA: Insufficient documentation

## 2013-01-15 DIAGNOSIS — I10 Essential (primary) hypertension: Secondary | ICD-10-CM | POA: Insufficient documentation

## 2013-01-15 NOTE — ED Notes (Signed)
Pt arrived to ED with a complaint of a witnessed fall.  Pt lives [address] at the [facility].  Pt complaining left hip pain and has a abrasion on her left knee. Pt has dementia but according to EMS she is ambulatory.

## 2013-01-15 NOTE — ED Provider Notes (Signed)
CSN: [id number]     Arrival date & time 01/15/13  1907 History   First MD Initiated Contact with Patient 01/15/13 1921     Chief Complaint  Patient presents with  . Fall   (Consider location/radiation/quality/duration/timing/severity/associated sxs/prior Treatment) Patient is a 79 y.o. female presenting with fall. The history is provided by the patient and the EMS personnel. The history is limited by the condition of the patient.  Fall  pt with hx advanced dementia, from e[facility], s/p witnessed fall. No loc. ?left hip and knee pain.  Pt v limited historian given dementia - level 5 caveat. ems notes pts mental status c/w baseline.     Past Medical History  Diagnosis Date  . Alzheimer disease   . Depression   . Hypertension   . Osteoporosis   . GERD (gastroesophageal reflux disease)    Past Surgical History  Procedure Laterality Date  . Back surgery     History reviewed. No pertinent family history. History  Substance Use Topics  . Smoking status: Never Smoker   . Smokeless tobacco: Never Used  . Alcohol Use: No   OB History   Grav Para Term Preterm Abortions TAB SAB Ect Mult Living                 Review of Systems  Unable to perform ROS: Dementia  Constitutional: Negative for fever.  level 5 caveat.    Allergies  Iodine and Shellfish allergy  Home Medications   Current Outpatient Rx  Name  Route  Sig  Dispense  Refill  . cephALEXin (KEFLEX) 500 MG capsule   Oral   Take 1 capsule (500 mg total) by mouth 4 (four) times daily.   40 capsule   0   . escitalopram (LEXAPRO) 20 MG tablet   Oral   Take 20 mg by mouth every morning.         . feeding supplement (ENSURE IMMUNE HEALTH) LIQD   Oral   Take 237 mLs by mouth 2 (two) times daily.         . furosemide (LASIX) 40 MG tablet   Oral   Take 1 tablet (40 mg total) by mouth daily.   30 tablet   0   . HYDROcodone-acetaminophen (NORCO) 5-325 MG per tablet   Oral   Take 1 tablet by mouth every 6 (six)  hours as needed for pain.   10 tablet   0   . LORazepam (ATIVAN) 0.5 MG tablet   Oral   Take 0.25 mg by mouth every 4 (four) hours as needed (agitation).         . metoprolol succinate (TOPROL-XL) 25 MG 24 hr tablet   Oral   Take 1 tablet (25 mg total) by mouth daily.   30 tablet   0   . mirtazapine (REMERON) 15 MG tablet   Oral   Take 7.5 mg by mouth at bedtime.         . nitroGLYCERIN (NITROSTAT) 0.4 MG SL tablet   Sublingual   Place 0.4 mg under the tongue every 5 (five) minutes as needed for chest pain.         Marland Kitchen zinc oxide 20 % ointment   Topical   Apply 1 application topically 3 (three) times daily.          BP 154/116  Temp(Src) 97.9 F (36.6 C) (Oral)  Resp 23  SpO2 97% Physical Exam  Nursing note and vitals reviewed. Constitutional: She appears well-developed and  well-nourished. No distress.  HENT:  Head: Atraumatic.  Mouth/Throat: Oropharynx is clear and moist.  No facial or scalp sts, bruising, contusion or tenderness  Eyes: Conjunctivae are normal. Pupils are equal, round, and reactive to light. No scleral icterus.  Neck: Normal range of motion. Neck supple. No tracheal deviation present.  Cardiovascular: Normal rate, normal heart sounds and intact distal pulses.   Pulmonary/Chest: Effort normal and breath sounds normal. No respiratory distress. She exhibits no tenderness.  Abdominal: Soft. Normal appearance. She exhibits no distension. There is no tenderness.  Musculoskeletal: She exhibits no edema.  ?tenderness left hip and knee.  Good rom bil ext, no gross deformity noted, no shortening or malrotation. No other focal bony tenderness noted. Distal pulses palp. Knees stable, no effusion.   CTLS spine, non tender, aligned, no step off.   Neurological: She is alert.  Alert, confused.  Moves bil extremities purposefully.   Skin: Skin is warm and dry. No rash noted.  Psychiatric:  Alert, content.     ED Course  Procedures (including  critical care time)  Dg Hip Complete Left  01/15/2013   CLINICAL DATA:  Status post fall with pain  EXAM: LEFT HIP - COMPLETE 2+ VIEW  COMPARISON:  None.  FINDINGS: There is no evidence of hip fracture or dislocation. There are degenerative joint changes bilateral hips with narrowed joint space and osteophyte formation.  IMPRESSION: No acute fracture or dislocation of left hip.   Electronically Signed   By: Sherian Rein M.D.   On: 01/15/2013 20:57   Dg Knee Complete 4 Views Left  01/15/2013   CLINICAL DATA:  Fall.  Left knee pain.  EXAM: LEFT KNEE - COMPLETE 4+ VIEW  COMPARISON:  None.  FINDINGS: Chondrocalcinosis within the left knee. No acute bony abnormality. Specifically, no fracture, subluxation, or dislocation. Soft tissues are intact. No joint effusion.  IMPRESSION: No acute bony abnormality.   Electronically Signed   By: Charlett Nose M.D.   On: 01/15/2013 20:55     EKG Interpretation   None       MDM  Xr.  Reviewed nursing notes and prior charts for additional history.   Recheck pt comfortable.   xrays neg acute.  Pt appear stable for d/c.       Suzi Roots, MD 01/15/13 2102

## 2013-01-15 NOTE — ED Notes (Signed)
Bed: WA14 Expected date:  Expected time:  Means of arrival:  Comments: EMS-fall 

## 2013-01-19 ENCOUNTER — Observation Stay (HOSPITAL_COMMUNITY)
Admission: EM | Admit: 2013-01-19 | Discharge: 2013-01-21 | Disposition: A | Discharge status: Skilled Nursing Facility | Attending: Neurosurgery | Admitting: Neurosurgery

## 2013-01-19 ENCOUNTER — Encounter (HOSPITAL_COMMUNITY): Payer: Self-pay | Admitting: Emergency Medicine

## 2013-01-19 ENCOUNTER — Emergency Department (HOSPITAL_COMMUNITY)

## 2013-01-19 DIAGNOSIS — Z79899 Other long term (current) drug therapy: Secondary | ICD-10-CM | POA: Insufficient documentation

## 2013-01-19 DIAGNOSIS — I6200 Nontraumatic subdural hemorrhage, unspecified: Principal | ICD-10-CM | POA: Insufficient documentation

## 2013-01-19 DIAGNOSIS — F3289 Other specified depressive episodes: Secondary | ICD-10-CM | POA: Insufficient documentation

## 2013-01-19 DIAGNOSIS — I10 Essential (primary) hypertension: Secondary | ICD-10-CM | POA: Insufficient documentation

## 2013-01-19 DIAGNOSIS — W19XXXA Unspecified fall, initial encounter: Secondary | ICD-10-CM | POA: Insufficient documentation

## 2013-01-19 DIAGNOSIS — Z9109 Other allergy status, other than to drugs and biological substances: Secondary | ICD-10-CM | POA: Insufficient documentation

## 2013-01-19 DIAGNOSIS — S065XAA Traumatic subdural hemorrhage with loss of consciousness status unknown, initial encounter: Secondary | ICD-10-CM | POA: Diagnosis present

## 2013-01-19 DIAGNOSIS — G309 Alzheimer's disease, unspecified: Secondary | ICD-10-CM | POA: Insufficient documentation

## 2013-01-19 DIAGNOSIS — Z91013 Allergy to seafood: Secondary | ICD-10-CM | POA: Insufficient documentation

## 2013-01-19 DIAGNOSIS — K219 Gastro-esophageal reflux disease without esophagitis: Secondary | ICD-10-CM | POA: Insufficient documentation

## 2013-01-19 DIAGNOSIS — I359 Nonrheumatic aortic valve disorder, unspecified: Secondary | ICD-10-CM | POA: Insufficient documentation

## 2013-01-19 DIAGNOSIS — F329 Major depressive disorder, single episode, unspecified: Secondary | ICD-10-CM | POA: Insufficient documentation

## 2013-01-19 DIAGNOSIS — F0280 Dementia in other diseases classified elsewhere without behavioral disturbance: Secondary | ICD-10-CM | POA: Insufficient documentation

## 2013-01-19 DIAGNOSIS — M810 Age-related osteoporosis without current pathological fracture: Secondary | ICD-10-CM | POA: Insufficient documentation

## 2013-01-19 DIAGNOSIS — S0180XA Unspecified open wound of other part of head, initial encounter: Secondary | ICD-10-CM | POA: Insufficient documentation

## 2013-01-19 DIAGNOSIS — S065X9A Traumatic subdural hemorrhage with loss of consciousness of unspecified duration, initial encounter: Secondary | ICD-10-CM

## 2013-01-19 HISTORY — DX: Encounter for palliative care: Z51.5

## 2013-01-19 LAB — PROTIME-INR
INR: 1.02 (ref 0.00–1.49)
Prothrombin Time: 13.2 seconds (ref 11.6–15.2)

## 2013-01-19 LAB — CBC
MCV: 96.6 fL (ref 78.0–100.0)
Platelets: 247 10*3/uL (ref 150–400)
RBC: 3.82 MIL/uL — ABNORMAL LOW (ref 3.87–5.11)
RDW: 13.6 % (ref 11.5–15.5)
WBC: 12.8 10*3/uL — ABNORMAL HIGH (ref 4.0–10.5)

## 2013-01-19 LAB — BASIC METABOLIC PANEL
CO2: 30 mEq/L (ref 19–32)
Chloride: 104 mEq/L (ref 96–112)
GFR calc Af Amer: 80 mL/min — ABNORMAL LOW (ref >90)
Potassium: 4.1 mEq/L (ref 3.5–5.1)

## 2013-01-19 MED ORDER — FENTANYL CITRATE 0.05 MG/ML IJ SOLN
20.0000 ug | Freq: Once | INTRAMUSCULAR | Status: AC
  Administered 2013-01-19: 20 ug via INTRAVENOUS
  Filled 2013-01-19: qty 2

## 2013-01-19 MED ORDER — HYDROCODONE-ACETAMINOPHEN 5-325 MG PO TABS: 1.0000 | ORAL_TABLET | Freq: Four times a day (QID) | ORAL | Status: DC | PRN

## 2013-01-19 MED ORDER — ONDANSETRON HCL 4 MG PO TABS: 4.0000 mg | ORAL_TABLET | Freq: Four times a day (QID) | ORAL | Status: DC | PRN

## 2013-01-19 MED ORDER — ENSURE COMPLETE PO LIQD
237.0000 mL | Freq: Two times a day (BID) | ORAL | Status: DC
  Administered 2013-01-20 – 2013-01-21 (×2): 237 mL via ORAL

## 2013-01-19 MED ORDER — FUROSEMIDE 20 MG PO TABS
20.0000 mg | ORAL_TABLET | Freq: Every morning | ORAL | Status: DC
  Administered 2013-01-20 – 2013-01-21 (×2): 20 mg via ORAL
  Filled 2013-01-19 (×2): qty 1

## 2013-01-19 MED ORDER — ESCITALOPRAM OXALATE 20 MG PO TABS
20.0000 mg | ORAL_TABLET | Freq: Every morning | ORAL | Status: DC
  Administered 2013-01-20 – 2013-01-21 (×2): 20 mg via ORAL
  Filled 2013-01-19 (×2): qty 1

## 2013-01-19 MED ORDER — ENSURE IMMUNE HEALTH PO LIQD: 237.0000 mL | Freq: Two times a day (BID) | ORAL | Status: DC

## 2013-01-19 MED ORDER — NITROGLYCERIN 0.4 MG SL SUBL: 0.4000 mg | SUBLINGUAL_TABLET | SUBLINGUAL | Status: DC | PRN

## 2013-01-19 MED ORDER — ONDANSETRON HCL 4 MG/2ML IJ SOLN: 4.0000 mg | Freq: Four times a day (QID) | INTRAMUSCULAR | Status: DC | PRN

## 2013-01-19 MED ORDER — METOPROLOL SUCCINATE ER 25 MG PO TB24
25.0000 mg | ORAL_TABLET | Freq: Every day | ORAL | Status: DC
  Administered 2013-01-20 – 2013-01-21 (×2): 25 mg via ORAL
  Filled 2013-01-19 (×2): qty 1

## 2013-01-19 MED ORDER — LEVOFLOXACIN 250 MG PO TABS
250.0000 mg | ORAL_TABLET | Freq: Every day | ORAL | Status: DC
  Administered 2013-01-20 – 2013-01-21 (×2): 250 mg via ORAL
  Filled 2013-01-19 (×2): qty 1

## 2013-01-19 MED ORDER — IPRATROPIUM BROMIDE 0.02 % IN SOLN: 0.5000 mg | Freq: Four times a day (QID) | RESPIRATORY_TRACT | Status: DC | PRN

## 2013-01-19 NOTE — ED Notes (Signed)
IV site wrapped for protection

## 2013-01-19 NOTE — ED Notes (Signed)
Pt has DNR. Pt was getting up from her chair and landed on her right forehead. PT has hematoma that is spurting blood from the right eyebrow. Pt is more lethargic than usual. Pt is responsive to verbal stimulation and has hx of alzheimer.  20 LAC BP- 180/80 HR- 70 RR- 20  SpO2-95%RA Towel used at C-collar.  Pt not complaining of pain any where.

## 2013-01-19 NOTE — ED Provider Notes (Signed)
CSN: [id number]     Arrival date & time 01/19/13  1529 History   First MD Initiated Contact with Patient 01/19/13 1558     Chief Complaint  Patient presents with  . Fall   (Consider location/radiation/quality/duration/timing/severity/associated sxs/prior Treatment) HPI Comments: 79 year old female comes from hospice care status Phiona Ramnauth fall. Patient reported to stand up from the chair this afternoon and fell straight forward striking the right side of the face the ground. Hospice noted significant bleeding from the right forehead. Unknown LOC. Pt with advanced dementia on hospice for critical aortic stenosis and advanced alzheimers  Patient is a 79 y.o. female presenting with fall.  Fall This is a new problem. The current episode started today. The problem occurs every several days. The problem has been unchanged. Associated symptoms comments: Unable to state . Nothing aggravates the symptoms. She has tried nothing for the symptoms.    Past Medical History  Diagnosis Date  . Alzheimer disease   . Depression   . Hypertension   . Osteoporosis   . GERD (gastroesophageal reflux disease)   . Hospice care patient     [facility] [phone]- Dx: Aortic Valve Disorder   Past Surgical History  Procedure Laterality Date  . Back surgery     History reviewed. No pertinent family history. History  Substance Use Topics  . Smoking status: Never Smoker   . Smokeless tobacco: Never Used  . Alcohol Use: No   OB History   Grav Para Term Preterm Abortions TAB SAB Ect Mult Living                 Review of Systems  Unable to perform ROS: Dementia    Allergies  Iodine and Shellfish allergy  Home Medications   Current Outpatient Rx  Name  Route  Sig  Dispense  Refill  . escitalopram (LEXAPRO) 20 MG tablet   Oral   Take 20 mg by mouth every morning.         . feeding supplement (ENSURE IMMUNE HEALTH) LIQD   Oral   Take 237 mLs by mouth 2 (two) times daily. CHOCOLATE         . furosemide  (LASIX) 20 MG tablet   Oral   Take 20 mg by mouth every morning.         Marland Kitchen HYDROcodone-acetaminophen (NORCO) 5-325 MG per tablet   Oral   Take 1 tablet by mouth every 6 (six) hours as needed for pain.   10 tablet   0   . ipratropium (ATROVENT) 0.02 % nebulizer solution   Nebulization   Take 0.5 mg by nebulization every 6 (six) hours as needed for wheezing or shortness of breath.         . levofloxacin (LEVAQUIN) 250 MG tablet   Oral   Take 250 mg by mouth daily.         Marland Kitchen LORazepam (ATIVAN) 0.5 MG tablet   Oral   Take 0.5 mg by mouth 2 (two) times daily.          . metoprolol succinate (TOPROL-XL) 25 MG 24 hr tablet   Oral   Take 1 tablet (25 mg total) by mouth daily.   30 tablet   0   . mirtazapine (REMERON) 15 MG tablet   Oral   Take 7.5 mg by mouth at bedtime.         . nitroGLYCERIN (NITROSTAT) 0.4 MG SL tablet   Sublingual   Place 0.4 mg under the tongue every 5 (  five) minutes as needed for chest pain.         Marland Kitchen zinc oxide 20 % ointment   Topical   Apply 1 application topically 3 (three) times daily.          BP 150/109  Pulse 88  Temp(Src) 98.2 F (36.8 C) (Oral)  Resp 19  SpO2 90% Physical Exam  Nursing note and vitals reviewed. Constitutional:  Frail appearing   HENT:  Head: Normocephalic.  R orbit swelling and laceration now hemostatic   Eyes: Pupils are equal, round, and reactive to light.  Neck:  In collar  Cardiovascular: Normal rate.   Murmur heard. Pulmonary/Chest: Effort normal. She exhibits no tenderness.  Abdominal: Soft. She exhibits no distension. There is no guarding.  Musculoskeletal: She exhibits no edema.  Neurological: She is disoriented. No cranial nerve deficit or sensory deficit. GCS eye subscore is 4. GCS verbal subscore is 4. GCS motor subscore is 6.  Appears at baseline of advanced dementia     ED Course  Procedures (including critical care time) Labs Review Labs Reviewed  CBC - Abnormal; Notable for  the following:    WBC 12.8 (*)    RBC 3.82 (*)    Hemoglobin 11.9 (*)    All other components within normal limits  BASIC METABOLIC PANEL - Abnormal; Notable for the following:    Glucose, Bld 101 (*)    GFR calc non Af Amer 69 (*)    GFR calc Af Amer 80 (*)    All other components within normal limits  PROTIME-INR   Imaging Review Ct Head Wo Contrast  01/19/2013   CLINICAL DATA:  Status Chantal Worthey fall.  Facial lacerations.  EXAM: CT HEAD WITHOUT CONTRAST  CT MAXILLOFACIAL WITHOUT CONTRAST  CT CERVICAL SPINE WITHOUT CONTRAST  TECHNIQUE: Multidetector CT imaging of the head, cervical spine, and maxillofacial structures were performed using the standard protocol without intravenous contrast. Multiplanar CT image reconstructions of the cervical spine and maxillofacial structures were also generated.  COMPARISON:  Prior examinations 10/01/2012.  FINDINGS: CT HEAD FINDINGS  There is moderate soft tissue swelling in the right frontotemporal scalp. There is a right frontotemporal subdural hematoma measuring up to 6 mm in thickness. Within the right frontal lobe, there is a 1 cm hematoma in the periventricular white matter, likely representing shear injury. No other foci of acute intracranial hemorrhage are identified.  There is stable atrophy with periventricular white matter disease. No midline shift or hydrocephalus is demonstrated. The calvarium is intact.  CT MAXILLOFACIAL FINDINGS  There is right periorbital soft tissue swelling. The globes are intact. There is evidence of scleral banding procedures bilaterally. There is a small calcification posteriorly in the right globe.  The paranasal sinuses are clear. There is no evidence of acute facial fracture.  CT CERVICAL SPINE FINDINGS  There is reversal of the usual cervical lordosis with multilevel spondylosis. No acute fracture or traumatic subluxation is identified. Several facet joints are fused. No acute soft tissue findings are demonstrated E neck.  Biapical pulmonary parenchymal scarring is similar to priors. There is a small right pleural effusion.  IMPRESSION: 1. Right frontotemporal acute subdural hematoma with adjacent small intraparenchymal hemorrhage in the right frontal periventricular white matter. No midline shift. 2. Right periorbital and frontal scalp soft tissue swelling. 3. No evidence of acute calvarial, facial or cervical spine fracture. 4. Critical Value/emergent results were called by telephone at the time of interpretation on 01/19/2013 at 6:18 PM to Dr.Harrison, who verbally acknowledged these results.  Electronically Signed   By: Roxy Horseman M.D.   On: 01/19/2013 18:20   Ct Cervical Spine Wo Contrast  01/19/2013   CLINICAL DATA:  Status Murphy Duzan fall.  Facial lacerations.  EXAM: CT HEAD WITHOUT CONTRAST  CT MAXILLOFACIAL WITHOUT CONTRAST  CT CERVICAL SPINE WITHOUT CONTRAST  TECHNIQUE: Multidetector CT imaging of the head, cervical spine, and maxillofacial structures were performed using the standard protocol without intravenous contrast. Multiplanar CT image reconstructions of the cervical spine and maxillofacial structures were also generated.  COMPARISON:  Prior examinations 10/01/2012.  FINDINGS: CT HEAD FINDINGS  There is moderate soft tissue swelling in the right frontotemporal scalp. There is a right frontotemporal subdural hematoma measuring up to 6 mm in thickness. Within the right frontal lobe, there is a 1 cm hematoma in the periventricular white matter, likely representing shear injury. No other foci of acute intracranial hemorrhage are identified.  There is stable atrophy with periventricular white matter disease. No midline shift or hydrocephalus is demonstrated. The calvarium is intact.  CT MAXILLOFACIAL FINDINGS  There is right periorbital soft tissue swelling. The globes are intact. There is evidence of scleral banding procedures bilaterally. There is a small calcification posteriorly in the right globe.  The paranasal  sinuses are clear. There is no evidence of acute facial fracture.  CT CERVICAL SPINE FINDINGS  There is reversal of the usual cervical lordosis with multilevel spondylosis. No acute fracture or traumatic subluxation is identified. Several facet joints are fused. No acute soft tissue findings are demonstrated E neck. Biapical pulmonary parenchymal scarring is similar to priors. There is a small right pleural effusion.  IMPRESSION: 1. Right frontotemporal acute subdural hematoma with adjacent small intraparenchymal hemorrhage in the right frontal periventricular white matter. No midline shift. 2. Right periorbital and frontal scalp soft tissue swelling. 3. No evidence of acute calvarial, facial or cervical spine fracture. 4. Critical Value/emergent results were called by telephone at the time of interpretation on 01/19/2013 at 6:18 PM to Dr.Harrison, who verbally acknowledged these results.   Electronically Signed   By: Roxy Horseman M.D.   On: 01/19/2013 18:20   Ct Maxillofacial Wo Cm  01/19/2013   CLINICAL DATA:  Status Cambryn Charters fall.  Facial lacerations.  EXAM: CT HEAD WITHOUT CONTRAST  CT MAXILLOFACIAL WITHOUT CONTRAST  CT CERVICAL SPINE WITHOUT CONTRAST  TECHNIQUE: Multidetector CT imaging of the head, cervical spine, and maxillofacial structures were performed using the standard protocol without intravenous contrast. Multiplanar CT image reconstructions of the cervical spine and maxillofacial structures were also generated.  COMPARISON:  Prior examinations 10/01/2012.  FINDINGS: CT HEAD FINDINGS  There is moderate soft tissue swelling in the right frontotemporal scalp. There is a right frontotemporal subdural hematoma measuring up to 6 mm in thickness. Within the right frontal lobe, there is a 1 cm hematoma in the periventricular white matter, likely representing shear injury. No other foci of acute intracranial hemorrhage are identified.  There is stable atrophy with periventricular white matter disease. No  midline shift or hydrocephalus is demonstrated. The calvarium is intact.  CT MAXILLOFACIAL FINDINGS  There is right periorbital soft tissue swelling. The globes are intact. There is evidence of scleral banding procedures bilaterally. There is a small calcification posteriorly in the right globe.  The paranasal sinuses are clear. There is no evidence of acute facial fracture.  CT CERVICAL SPINE FINDINGS  There is reversal of the usual cervical lordosis with multilevel spondylosis. No acute fracture or traumatic subluxation is identified. Several facet joints are fused. No  acute soft tissue findings are demonstrated E neck. Biapical pulmonary parenchymal scarring is similar to priors. There is a small right pleural effusion.  IMPRESSION: 1. Right frontotemporal acute subdural hematoma with adjacent small intraparenchymal hemorrhage in the right frontal periventricular white matter. No midline shift. 2. Right periorbital and frontal scalp soft tissue swelling. 3. No evidence of acute calvarial, facial or cervical spine fracture. 4. Critical Value/emergent results were called by telephone at the time of interpretation on 01/19/2013 at 6:18 PM to Dr.Harrison, who verbally acknowledged these results.   Electronically Signed   By: Roxy Horseman M.D.   On: 01/19/2013 18:20    EKG Interpretation   None       MDM   1. Subdural hematoma, acute   2. Fall, initial encounter    79 year old female comes in status Dhriti Fales fall at hospice home. On initial examination patient does have a moderate-sized hematoma above the right orbit. Extraocular movements appear intact although patient very uncooperative with exam. Doubt ocular muscle entrapment. No active bleeding noted on arrival. No focal neuro deficits identified. Patient is hospice care. Discussed the family desires for workup and interventions. Family stated they would like to have imaging and interventions done at this time, despite being hospice patient. CT head,  face and C-spine obtained. Found right-sided subdural as well as intraparenchymal hemorrhage. No facial fractures or other injuries found. Patient not on blood thinners. Per family at bedside patient is at baseline with advanced Alzheimer's dementia. Neurosurgery consult for admission. Her surgeries admitted the patient. Patient with small laceration over the right eyebrow. Initial plan for Dermabond closure of this wound after irrigation. Wound was irrigated copiously. However at this time wound closed and hemostasis and no need for primary closure. Discussed the family is in agreement with this plan. Patient remained stable in the emergency department until transfer to floor.  Patient discussed with attending Dr. Romeo Apple.      Bridgett Larsson, MD 01/19/13 2038

## 2013-01-19 NOTE — H&P (Signed)
CC:  Chief Complaint  Patient presents with  . Fall    HPI: Natasha Cline is a 79 y.o. female resident of a hospice care facility for intractable aortic stenosis. She also has advanced alzheimer's dementia. She was found at the facility today after a fall with a right-sided scalp laceration. She was brought to the ED and found to have a small acute right SDH with a small IPH. According to her daughter at bedside, she is at her baseline.  PMH: Past Medical History  Diagnosis Date  . Alzheimer disease   . Depression   . Hypertension   . Osteoporosis   . GERD (gastroesophageal reflux disease)   . Hospice care patient     [facility] [phone]- Dx: Aortic Valve Disorder    PSH: Past Surgical History  Procedure Laterality Date  . Back surgery      SH: History  Substance Use Topics  . Smoking status: Never Smoker   . Smokeless tobacco: Never Used  . Alcohol Use: No    MEDS: Prior to Admission medications   Medication Sig Start Date End Date Taking? Authorizing Provider  escitalopram (LEXAPRO) 20 MG tablet Take 20 mg by mouth every morning.   Yes Historical Provider, MD  feeding supplement (ENSURE IMMUNE HEALTH) LIQD Take 237 mLs by mouth 2 (two) times daily. CHOCOLATE   Yes Historical Provider, MD  furosemide (LASIX) 20 MG tablet Take 20 mg by mouth every morning.   Yes Historical Provider, MD  HYDROcodone-acetaminophen (NORCO) 5-325 MG per tablet Take 1 tablet by mouth every 6 (six) hours as needed for pain. 09/20/12  Yes Abigail Harris, PA-C  ipratropium (ATROVENT) 0.02 % nebulizer solution Take 0.5 mg by nebulization every 6 (six) hours as needed for wheezing or shortness of breath.   Yes Historical Provider, MD  levofloxacin (LEVAQUIN) 250 MG tablet Take 250 mg by mouth daily.   Yes Historical Provider, MD  LORazepam (ATIVAN) 0.5 MG tablet Take 0.5 mg by mouth 2 (two) times daily.    Yes Historical Provider, MD  metoprolol succinate (TOPROL-XL) 25 MG 24 hr tablet Take 1 tablet  (25 mg total) by mouth daily. 08/10/12  Yes Catarina Hartshorn, MD  mirtazapine (REMERON) 15 MG tablet Take 7.5 mg by mouth at bedtime.   Yes Historical Provider, MD  nitroGLYCERIN (NITROSTAT) 0.4 MG SL tablet Place 0.4 mg under the tongue every 5 (five) minutes as needed for chest pain.   Yes Historical Provider, MD  zinc oxide 20 % ointment Apply 1 application topically 3 (three) times daily.   Yes Historical Provider, MD    ALLERGY: Allergies  Allergen Reactions  . Iodine     Reaction unknown  . Shellfish Allergy     Reaction unknown    NEUROLOGIC EXAM: Awake, alert, Occasionally speaks to her daughter Intermittently follows commands Moves all extremities purposefully  IMGAING: CTH with small right acute hemispheric SDH with small periventricular right frontal IPH. No MLS or mass effect. CT Cspine without fx or subluxation.  IMPRESSION: - 78 y.o. female s/p fall with small right SDH and IPH with baseline advanced dementia, under hospice care for aortic stenosis.  PLAN: - Admit to obs - repeat CTH in am and likely d/c back to hospice care.  I spoke with the patient's daughter about the plan above. I did tell her that should the SDH progress, her prognosis would be very poor given the underlying conditions and that surgery would likely not be an option. The patient's daughter  understood this and would like to proceed with admission for observation. All her questions were answered.

## 2013-01-19 NOTE — ED Notes (Signed)
EMS did not have a small enough C-Collar. Soft C collar placed by RN and Tech .

## 2013-01-19 NOTE — ED Notes (Signed)
Dermabond at bedside.  

## 2013-01-19 NOTE — ED Notes (Signed)
Pt has hematoma above the right eyebrow. Bleeding is currently controlled. Pressure dressing was applied by EMS. EMS stated it was spurting at one time.

## 2013-01-19 NOTE — ED Notes (Signed)
Family at bedside. 

## 2013-01-19 NOTE — Progress Notes (Signed)
Room [facility] B16 - Evelen Vazguez - [facility] RN Visit-R.Faustine Tates RN  Related admission to [facility] diagnosis of aortic valve disorder.  Pt is DNR code with OOF DNR on shadow chart.    Pt arousable, not responsive to questions - only opens eyes for a few moments. Pt lying on ED stretcher with large dressing on R/side face/skull.  Appears comfortable.  No family present. Patient's home medication list is on shadow chart.   Pt brought from [facility] following fall in facility.     PLEASE CALL [phone] WITH DISPOSITION FROM ED  - THANK YOU.  Please call [facility] @ [phone]- ask for RN Liaison or after hours,ask for on-call RN with any hospice needs.   Thank you.  Joneen Boers, RN  [facility]  Hospice Liaison  [phone])

## 2013-01-19 NOTE — ED Notes (Signed)
MD at bedside. 

## 2013-01-19 NOTE — ED Notes (Signed)
Dressing removed for physician to assess site. Re-applied 4x4's with kerlex. Bleeding is under control at this time.

## 2013-01-19 NOTE — ED Notes (Signed)
Pharmacy at bedside

## 2013-01-20 ENCOUNTER — Observation Stay (HOSPITAL_COMMUNITY)

## 2013-01-20 NOTE — ED Provider Notes (Signed)
Medical screening examination/treatment/procedure(s) were conducted as a shared visit with resident physician and myself.  I personally evaluated the patient during the encounter.    EKG Interpretation    Date/Time:  Wednesday January 19 2013 16:02:31 EST Ventricular Rate:  78 PR Interval:  150 QRS Duration: 95 QT Interval:  461 QTC Calculation: 525 R Axis:   60 Text Interpretation:  Sinus rhythm Left ventricular hypertrophy Repol abnrm suggests ischemia, diffuse leads Prolonged QT interval No significant change since last tracing Confirmed by Jahmil Macleod  MD, Emmalene Kattner ([id number]) on 01/20/2013 3:55:32 PM            I interviewed and examined the patient. Lungs are CTAB. Cardiac exam wnl. Abdomen soft.  Hematoma to right forehead. Pt found to have Subdural hematoma. Will admit to [facility].   CRITICAL CARE Performed by: Purvis Sheffield, S Total critical care time: 30 min Critical care time was exclusive of separately billable procedures and treating other patients. Critical care was necessary to treat or prevent imminent or life-threatening deterioration. Critical care was time spent personally by me on the following activities: development of treatment plan with patient and/or surrogate as well as nursing, discussions with consultants, evaluation of patient's response to treatment, examination of patient, obtaining history from patient or surrogate, ordering and performing treatments and interventions, ordering and review of laboratory studies, ordering and review of radiographic studies, pulse oximetry and re-evaluation of patient's condition.  Clinical Impression 1. Subdural hematoma, acute   2. Fall, initial encounter   3. Subdural hematoma       Junius Argyle, MD 01/20/13 1558

## 2013-01-20 NOTE — Progress Notes (Addendum)
Room 4N 09 - Natasha Cline - [facility] RN Visit-R.Bryer Cozzolino RN  Related admission to [facility] diagnosis of Alzheimer's.  Pt is DNR code with OOF DNR on shadow chart.   Pt with eyes open, staring at ceiling, tracks with eyes, minimum verbal response to questions.  Pt lying in bed, appears comfortable - NT just finished bath & change of bed linens.    No family present.  Patient's home medication list is on shadow chart.   Pt admitted following fall in [facility] sustaining injury to R/sided face/head.  Per notes, pt sustained SDH.  Pt to return to facility in am(Friday 11/28) per discharge notes.  Pt may transport back to [facility] via [facility] please.  Please call [facility] @ [phone]- ask for RN Liaison or after hours,ask for on-call RN with any hospice needs.   Thank you.  Joneen Boers, RN  [facility]  Hospice Liaison  [phone])

## 2013-01-20 NOTE — Progress Notes (Signed)
Spoke to Child psychotherapist on call [place] paged to [phone] regarding pt being discharged to return to SNF. Stated the office is closed today and D/C will be handled tomorrow morning. Pt Daughter visited early and Dr. Gerlene Fee aware of possibility of overnight stay if facility not accepting Pt today being a holiday.

## 2013-01-20 NOTE — Discharge Summary (Signed)
  Physician Discharge Summary  Patient ID: Natasha Cline MRN: [id number] DOB/AGE: [birth year] 79 y.o.  Admit date: 01/19/2013 Discharge date: 01/20/2013  Admission Diagnoses:  Discharge Diagnoses:  Active Problems:   Subdural hematoma   Discharged Condition: stable  Hospital Course: Elderly demented patient admitted with tiny subdural bleed after ? Fall.Next day at her baseline. Severly demented. CT stable. Back to snf.  Consults: None  Significant Diagnostic Studies: none  Treatments: observation   Discharge Exam: Blood pressure 146/53, pulse 76, temperature 97.9 F (36.6 C), temperature source Oral, resp. rate 19, height 5\' 2"  (1.575 m), weight 51 kg (112 lb 7 oz), SpO2 93.00%. awake and alert; marked dementia  Disposition: 01-Home or Self Care     Medication List    ASK your doctor about these medications       escitalopram 20 MG tablet  Commonly known as:  LEXAPRO  Take 20 mg by mouth every morning.     feeding supplement Liqd  Take 237 mLs by mouth 2 (two) times daily. CHOCOLATE     furosemide 20 MG tablet  Commonly known as:  LASIX  Take 20 mg by mouth every morning.     HYDROcodone-acetaminophen 5-325 MG per tablet  Commonly known as:  NORCO  Take 1 tablet by mouth every 6 (six) hours as needed for pain.     ipratropium 0.02 % nebulizer solution  Commonly known as:  ATROVENT  Take 0.5 mg by nebulization every 6 (six) hours as needed for wheezing or shortness of breath.     levofloxacin 250 MG tablet  Commonly known as:  LEVAQUIN  Take 250 mg by mouth daily.     LORazepam 0.5 MG tablet  Commonly known as:  ATIVAN  Take 0.5 mg by mouth 2 (two) times daily.     metoprolol succinate 25 MG 24 hr tablet  Commonly known as:  TOPROL-XL  Take 1 tablet (25 mg total) by mouth daily.     mirtazapine 15 MG tablet  Commonly known as:  REMERON  Take 7.5 mg by mouth at bedtime.     nitroGLYCERIN 0.4 MG SL tablet  Commonly known as:  NITROSTAT   Place 0.4 mg under the tongue every 5 (five) minutes as needed for chest pain.     zinc oxide 20 % ointment  Apply 1 application topically 3 (three) times daily.         At home rest most of the time. Get up 9 or 10 times each day and take a 15 or 20 minute walk. No riding in the car and to your first postoperative appointment. If you have neck surgery you may shower from the chest down starting on the third postoperative day. If you had back surgery he may start showering on the third postoperative day with saran wrap wrapped around your incisional area 3 times. After the shower remove the saran wrap. Take pain medicine as needed and other medications as instructed. Call my office for an appointment.  SignedReinaldo Meeker, MD 01/20/2013, 11:09 AM

## 2013-01-21 NOTE — Progress Notes (Signed)
[facility] [place] [facility], gave report to Nancy,RN(hospice nurse). Pt is ready to d/c. Natasha Cline

## 2013-01-21 NOTE — Progress Notes (Signed)
UR completed 

## 2013-01-21 NOTE — Progress Notes (Signed)
CSW sent Pt FL2, D/C summary, and D/C AVS to facility for Pt d/c today.   CSW will continue to follow Pt for d/c planning.    Leron Croak [facility]  4N 1-16;  [phone] Phone: [phone]

## 2013-01-21 NOTE — Progress Notes (Signed)
Room [facility] 4N 09-Carrieanne Reichardt -[facility] RN Visit-R.Yanel Dombrosky RN  Related admission to [facility] diagnosis of aortic valve disorder.  Pt is DNR code with OOF DNR on shadow chart.    Pt lying in bed, sleeping soundly, appears comfortable.   No family present.  Patient's home medication list is on shadow chart.   Per chart notes, Attending Dr. Gerlene Fee has written discharge summary/instructions for pt to return to [facility] today.  Discussed with hospital SW Cassandra who will call the facility to verify re-admission and arrange transport.  [facility] staff advised of discharge.   Please call [facility] @ [phone]- ask for RN Liaison or after hours,ask for on-call RN with any hospice needs.   Thank you.  Joneen Boers, RN  [facility]  Hospice Liaison  [phone])

## 2013-01-21 NOTE — Discharge Summary (Signed)
Physician Discharge Summary  Patient ID: Natasha Cline MRN: [id number] DOB/AGE: August 15, 1915 79 y.o.  Admit date: 01/19/2013 Discharge date: 01/21/2013  Admission Diagnoses:  Discharge Diagnoses:  Active Problems:   Subdural hematoma   Discharged Condition: stable  Hospital Course: 79 yo female admitted from nursing home after fall. Had tiny amount of blood on ct head and admitted for obs. By day after admission at baseline. No change on Ct. Back to facility at that time.  Consults: None  Significant Diagnostic Studies: none  Treatments: none  Discharge Exam: Blood pressure 143/80, pulse 76, temperature 97.8 F (36.6 C), temperature source Oral, resp. rate 20, height 5\' 2"  (1.575 m), weight 51 kg (112 lb 7 oz), SpO2 94.00%. awake, alert, but severe dementia.Marland Kitchenbrusie by right eye  Disposition: 01-Home or Self Care  Discharge Orders   Future Orders Complete By Expires   Call MD for:  difficulty breathing, headache or visual disturbances  As directed    Call MD for:  hives  As directed    Call MD for:  persistant nausea and vomiting  As directed    Call MD for:  redness, tenderness, or signs of infection (pain, swelling, redness, odor or green/yellow discharge around incision site)  As directed    Call MD for:  severe uncontrolled pain  As directed    Call MD for:  temperature >100.4  As directed    Diet general  As directed    Discharge instructions  As directed    Comments:     Mostly bedrest. Get up 9 or 10 times each day and walk for 15-20 minutes each time. Very little sitting the first week. No riding in the car until your first post op appointment. If you had neck surgery...may shower from the chest down. If you had low back surgery....you may shower with a saran wrap covering over the incision. Take your pain medicine as needed...and other medicines that you are instructed to take. Call for an appointment...[phone].       Medication List         escitalopram 20 MG tablet  Commonly known as:  LEXAPRO  Take 20 mg by mouth every morning.     feeding supplement Liqd  Take 237 mLs by mouth 2 (two) times daily. CHOCOLATE     furosemide 20 MG tablet  Commonly known as:  LASIX  Take 20 mg by mouth every morning.     HYDROcodone-acetaminophen 5-325 MG per tablet  Commonly known as:  NORCO  Take 1 tablet by mouth every 6 (six) hours as needed for pain.     ipratropium 0.02 % nebulizer solution  Commonly known as:  ATROVENT  Take 0.5 mg by nebulization every 6 (six) hours as needed for wheezing or shortness of breath.     levofloxacin 250 MG tablet  Commonly known as:  LEVAQUIN  Take 250 mg by mouth daily.     LORazepam 0.5 MG tablet  Commonly known as:  ATIVAN  Take 0.5 mg by mouth 2 (two) times daily.     metoprolol succinate 25 MG 24 hr tablet  Commonly known as:  TOPROL-XL  Take 1 tablet (25 mg total) by mouth daily.     mirtazapine 15 MG tablet  Commonly known as:  REMERON  Take 7.5 mg by mouth at bedtime.     nitroGLYCERIN 0.4 MG SL tablet  Commonly known as:  NITROSTAT  Place 0.4 mg under the tongue every 5 (five) minutes as needed  for chest pain.     zinc oxide 20 % ointment  Apply 1 application topically 3 (three) times daily.         At home rest most of the time. Get up 9 or 10 times each day and take a 15 or 20 minute walk. No riding in the car and to your first postoperative appointment. If you have neck surgery you may shower from the chest down starting on the third postoperative day. If you had back surgery he may start showering on the third postoperative day with saran wrap wrapped around your incisional area 3 times. After the shower remove the saran wrap. Take pain medicine as needed and other medications as instructed. Call my office for an appointment.  SignedReinaldo Meeker, MD 01/21/2013, 10:51 AM

## 2013-01-21 NOTE — Progress Notes (Signed)
Pt to be d/c today to [facility].   Pt and family agreeable. Confirmed plans with facility.  Plan transfer via EMS.    Natasha Cline [facility]  4N 1-16;  [id number] Phone: [phone]

## 2013-11-11 ENCOUNTER — Emergency Department (HOSPITAL_COMMUNITY)
Admission: EM | Admit: 2013-11-11 | Discharge: 2013-11-11 | Disposition: A | Discharge status: Home / Self Care | Attending: Emergency Medicine | Admitting: Emergency Medicine

## 2013-11-11 ENCOUNTER — Encounter (HOSPITAL_COMMUNITY): Payer: Self-pay | Admitting: Emergency Medicine

## 2013-11-11 DIAGNOSIS — S0003XA Contusion of scalp, initial encounter: Secondary | ICD-10-CM | POA: Insufficient documentation

## 2013-11-11 DIAGNOSIS — W1809XA Striking against other object with subsequent fall, initial encounter: Secondary | ICD-10-CM | POA: Insufficient documentation

## 2013-11-11 DIAGNOSIS — F329 Major depressive disorder, single episode, unspecified: Secondary | ICD-10-CM | POA: Insufficient documentation

## 2013-11-11 DIAGNOSIS — Y9389 Activity, other specified: Secondary | ICD-10-CM | POA: Insufficient documentation

## 2013-11-11 DIAGNOSIS — Z8739 Personal history of other diseases of the musculoskeletal system and connective tissue: Secondary | ICD-10-CM | POA: Diagnosis not present

## 2013-11-11 DIAGNOSIS — G309 Alzheimer's disease, unspecified: Secondary | ICD-10-CM | POA: Diagnosis not present

## 2013-11-11 DIAGNOSIS — F0280 Dementia in other diseases classified elsewhere without behavioral disturbance: Secondary | ICD-10-CM | POA: Diagnosis not present

## 2013-11-11 DIAGNOSIS — Z79899 Other long term (current) drug therapy: Secondary | ICD-10-CM | POA: Diagnosis not present

## 2013-11-11 DIAGNOSIS — Z515 Encounter for palliative care: Secondary | ICD-10-CM | POA: Diagnosis not present

## 2013-11-11 DIAGNOSIS — F3289 Other specified depressive episodes: Secondary | ICD-10-CM | POA: Diagnosis not present

## 2013-11-11 DIAGNOSIS — S0990XA Unspecified injury of head, initial encounter: Secondary | ICD-10-CM | POA: Insufficient documentation

## 2013-11-11 DIAGNOSIS — I10 Essential (primary) hypertension: Secondary | ICD-10-CM | POA: Diagnosis not present

## 2013-11-11 DIAGNOSIS — W050XXA Fall from non-moving wheelchair, initial encounter: Secondary | ICD-10-CM | POA: Insufficient documentation

## 2013-11-11 DIAGNOSIS — S0083XA Contusion of other part of head, initial encounter: Principal | ICD-10-CM | POA: Insufficient documentation

## 2013-11-11 DIAGNOSIS — T148XXA Other injury of unspecified body region, initial encounter: Secondary | ICD-10-CM

## 2013-11-11 DIAGNOSIS — Y9210 Unspecified residential institution as the place of occurrence of the external cause: Secondary | ICD-10-CM | POA: Diagnosis not present

## 2013-11-11 DIAGNOSIS — Z792 Long term (current) use of antibiotics: Secondary | ICD-10-CM | POA: Diagnosis not present

## 2013-11-11 DIAGNOSIS — S1093XA Contusion of unspecified part of neck, initial encounter: Principal | ICD-10-CM

## 2013-11-11 NOTE — Discharge Instructions (Signed)
Contusion °A contusion is a deep bruise. Contusions are the result of an injury that caused bleeding under the skin. The contusion may turn blue, purple, or yellow. Minor injuries will give you a painless contusion, but more severe contusions may stay painful and swollen for a few weeks.  °CAUSES  °A contusion is usually caused by a blow, trauma, or direct force to an area of the body. °SYMPTOMS  °· Swelling and redness of the injured area. °· Bruising of the injured area. °· Tenderness and soreness of the injured area. °· Pain. °DIAGNOSIS  °The diagnosis can be made by taking a history and physical exam. An X-ray, CT scan, or MRI may be needed to determine if there were any associated injuries, such as fractures. °TREATMENT  °Specific treatment will depend on what area of the body was injured. In general, the best treatment for a contusion is resting, icing, elevating, and applying cold compresses to the injured area. Over-the-counter medicines may also be recommended for pain control. Ask your caregiver what the best treatment is for your contusion. °HOME CARE INSTRUCTIONS  °· Put ice on the injured area. °¨ Put ice in a plastic bag. °¨ Place a towel between your skin and the bag. °¨ Leave the ice on for 15-20 minutes, 3-4 times a day, or as directed by your health care provider. °· Only take over-the-counter or prescription medicines for pain, discomfort, or fever as directed by your caregiver. Your caregiver may recommend avoiding anti-inflammatory medicines (aspirin, ibuprofen, and naproxen) for 48 hours because these medicines may increase bruising. °· Rest the injured area. °· If possible, elevate the injured area to reduce swelling. °SEEK IMMEDIATE MEDICAL CARE IF:  °· You have increased bruising or swelling. °· You have pain that is getting worse. °· Your swelling or pain is not relieved with medicines. °MAKE SURE YOU:  °· Understand these instructions. °· Will watch your condition. °· Will get help right  away if you are not doing well or get worse. °Document Released: 11/20/2004 Document Revised: 02/15/2013 Document Reviewed: 12/16/2010 °ExitCare® Patient Information ©2015 [employer]. This information is not intended to replace advice given to you by your health care provider. Make sure you discuss any questions you have with your health care provider. ° °

## 2013-11-11 NOTE — ED Notes (Signed)
[facility] CALLED

## 2013-11-11 NOTE — ED Notes (Signed)
Attempted report to nursing home with no response. Family at bedside. [facility] at bedside for transport. VSS.

## 2013-11-11 NOTE — ED Notes (Signed)
Pt came from nursing home via [facility], pt fell asleep in wheelchair and fell out and hit head, no reports of LOC or AMS.

## 2013-11-11 NOTE — ED Provider Notes (Signed)
CSN: [id number]     Arrival date & time 11/11/13  1011 History   First MD Initiated Contact with Patient 11/11/13 1013     No chief complaint on file.    (Consider location/radiation/quality/duration/timing/severity/associated sxs/prior Treatment) HPI Comments: Patient brought to the ER from nursing home by ambulance after a fall. Patient fell asleep while sitting in her wheelchair and fell forward, hit her head on the ground. No loss of consciousness. Patient is profoundly demented, cannot answer any questions upon arrival. At arrival she is awake and alert, moving all extremities equally. Level V Caveat due to dementia.   Past Medical History  Diagnosis Date  . Alzheimer disease   . Depression   . Hypertension   . Osteoporosis   . GERD (gastroesophageal reflux disease)   . Hospice care patient     [facility] [phone]- Dx: Aortic Valve Disorder   Past Surgical History  Procedure Laterality Date  . Back surgery     No family history on file. History  Substance Use Topics  . Smoking status: Never Smoker   . Smokeless tobacco: Never Used  . Alcohol Use: No   OB History   Grav Para Term Preterm Abortions TAB SAB Ect Mult Living                 Review of Systems  Unable to perform ROS: Dementia      Allergies  Iodine and Shellfish allergy  Home Medications   Prior to Admission medications   Medication Sig Start Date End Date Taking? Authorizing Provider  escitalopram (LEXAPRO) 20 MG tablet Take 20 mg by mouth every morning.    Historical Provider, MD  feeding supplement (ENSURE IMMUNE HEALTH) LIQD Take 237 mLs by mouth 2 (two) times daily. CHOCOLATE    Historical Provider, MD  furosemide (LASIX) 20 MG tablet Take 20 mg by mouth every morning.    Historical Provider, MD  HYDROcodone-acetaminophen (NORCO) 5-325 MG per tablet Take 1 tablet by mouth every 6 (six) hours as needed for pain. 09/20/12   Arthor Captain, PA-C  ipratropium (ATROVENT) 0.02 % nebulizer solution Take 0.5  mg by nebulization every 6 (six) hours as needed for wheezing or shortness of breath.    Historical Provider, MD  levofloxacin (LEVAQUIN) 250 MG tablet Take 250 mg by mouth daily.    Historical Provider, MD  LORazepam (ATIVAN) 0.5 MG tablet Take 0.5 mg by mouth 2 (two) times daily.     Historical Provider, MD  metoprolol succinate (TOPROL-XL) 25 MG 24 hr tablet Take 1 tablet (25 mg total) by mouth daily. 08/10/12   Catarina Hartshorn, MD  mirtazapine (REMERON) 15 MG tablet Take 7.5 mg by mouth at bedtime.    Historical Provider, MD  nitroGLYCERIN (NITROSTAT) 0.4 MG SL tablet Place 0.4 mg under the tongue every 5 (five) minutes as needed for chest pain.    Historical Provider, MD  zinc oxide 20 % ointment Apply 1 application topically 3 (three) times daily.    Historical Provider, MD   There were no vitals taken for this visit. Physical Exam  Constitutional: She appears well-developed and well-nourished. No distress.  HENT:  Head: Normocephalic and atraumatic.    Right Ear: Hearing normal.  Left Ear: Hearing normal.  Nose: Nose normal.  Mouth/Throat: Oropharynx is clear and moist and mucous membranes are normal.  Contusion forehead, no overlying laceration or abrasion.  Eyes: Conjunctivae and EOM are normal. Pupils are equal, round, and reactive to light.  Neck: Normal range of  motion. Neck supple.  Cardiovascular: Regular rhythm, S1 normal and S2 normal.  Exam reveals no gallop and no friction rub.   No murmur heard. Pulmonary/Chest: Effort normal and breath sounds normal. No respiratory distress. She exhibits no tenderness.  Abdominal: Soft. Normal appearance and bowel sounds are normal. There is no hepatosplenomegaly. There is no tenderness. There is no rebound, no guarding, no tenderness at McBurney's point and negative Murphy's sign. No hernia.  Musculoskeletal: Normal range of motion.       Right hip: She exhibits normal range of motion and no deformity.       Left hip: She exhibits normal  range of motion and no deformity.       Cervical back: Normal.       Thoracic back: Normal.       Lumbar back: Normal.  Patient cannot answer questions about the examination, but examination of the spine did not reveal any step-offs, defects and did not elicit any cause for signs of pain palpating. There is no overlying abrasion or bruising.  She did have some chronic contractions at the region of the hips bilaterally. She does not seem to have any pain response with moving the hips bilaterally.  Neurological: She is alert. She has normal strength. She is disoriented. No cranial nerve deficit or sensory deficit. Coordination normal. GCS eye subscore is 4. GCS verbal subscore is 5. GCS motor subscore is 6.  Skin: Skin is warm, dry and intact. No rash noted. No cyanosis.  Psychiatric: She has a normal mood and affect. Her speech is normal and behavior is normal. Thought content normal.    ED Course  Procedures (including critical care time) Labs Review Labs Reviewed - No data to display  Imaging Review No results found.   EKG Interpretation None      MDM   Final diagnoses:  None   contusion  Presents to the ER after a ground level fall. Patient fell from a seated position, falling forward and hitting her forehead on the ground. According to staff, there was no loss of consciousness. Patient is not on any blood thinners. She does have a small to moderate hematoma on the high forehead. Patient was brought to the ER and mobilized. Evaluation of the thoracic and lumbosacral spine is unremarkable. No overlying abrasions, bruising or swelling. She did not have any pain response to palpation. Examination is, however, difficult because of her profound dementia. Patient does not have any significant pain response to passive range of motion at the hips. There is no obvious deformity.  Patient is DO NOT RESUSCITATE/DO NOT INTUBATE. She is also under hospice care. Patient's daughter is present in  the ER. I had a lengthy conversation with her about imaging. I do not feel imaging is necessary, as there are no findings that would prompt any intervention. If she has a fractured hip, compression fracture, cervical fracture, intracranial bleed, no surgery or intervention will be provided based on her hospice status and CODE STATUS. Patient is awake and alert, are normal mentation at baseline. She was observed for a period of time here in the ER he continues to do well. Daughter has decided not to have any imaging after discussing the situation with her sister. She will be returned to the nursing home.   Gilda Crease, MD 11/11/13 [phone]

## 2015-02-02 IMAGING — CT CT HEAD W/O CM
3 of 7 series · 15 of 47 positions shown, 18 images · non-contrast
Comparison: Prior examinations 10/01/2012.

CLINICAL DATA: Status post fall.  Facial lacerations.

EXAM:
CT HEAD WITHOUT CONTRAST
CT MAXILLOFACIAL WITHOUT CONTRAST
CT CERVICAL SPINE WITHOUT CONTRAST
TECHNIQUE: Multidetector CT imaging of the head, cervical spine, and
maxillofacial structures were performed using the standard protocol
without intravenous contrast. Multiplanar CT image reconstructions
of the cervical spine and maxillofacial structures were also
generated.

[Series 13: soft tissue · axial · 0.41mm/px · z∈[+87,+249]mm · 9 of 103 slices shown, 12 images]
[im 11/103  brain]
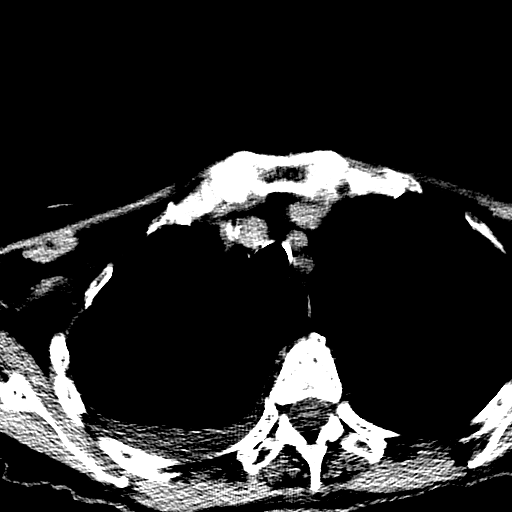
[im 11/103  bone]
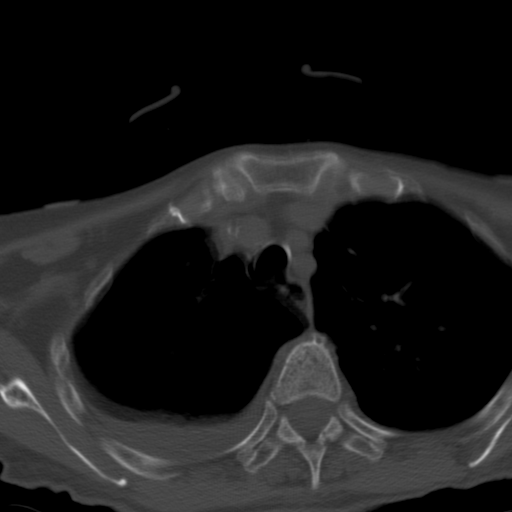
[im 21/103  brain]
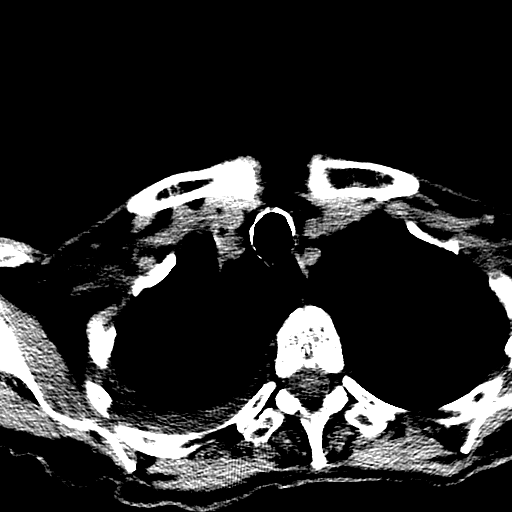
[im 31/103  brain]
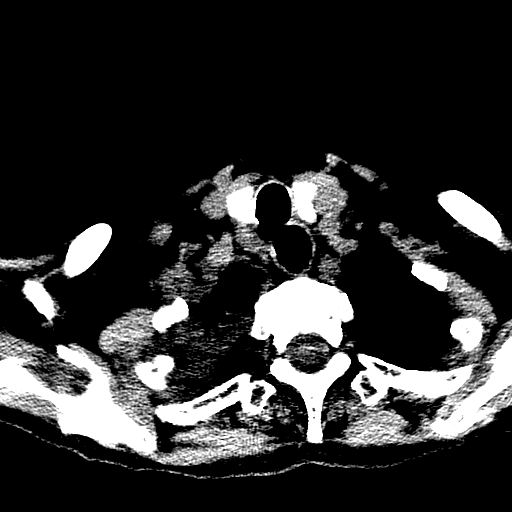
[im 41/103  brain]
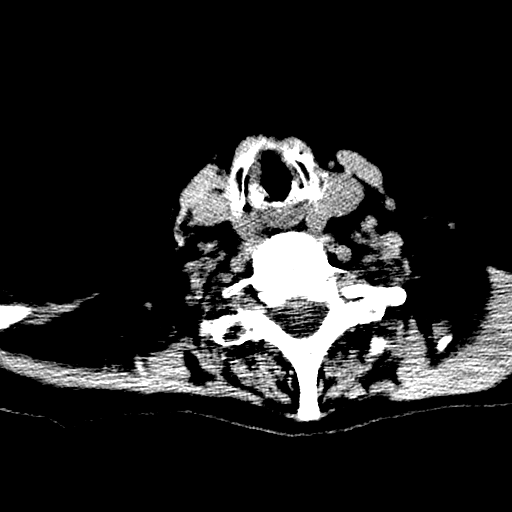
[im 52/103  brain]
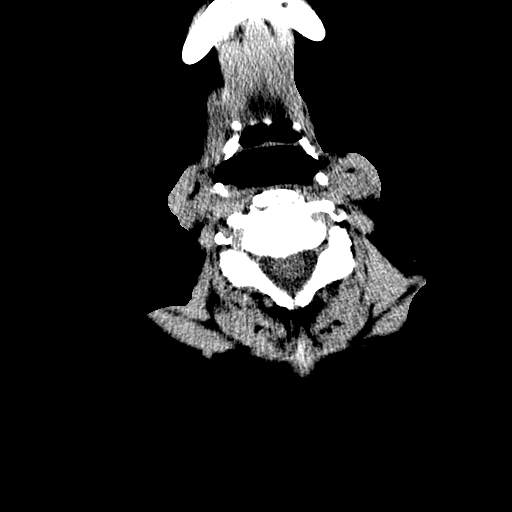
[im 52/103  bone]
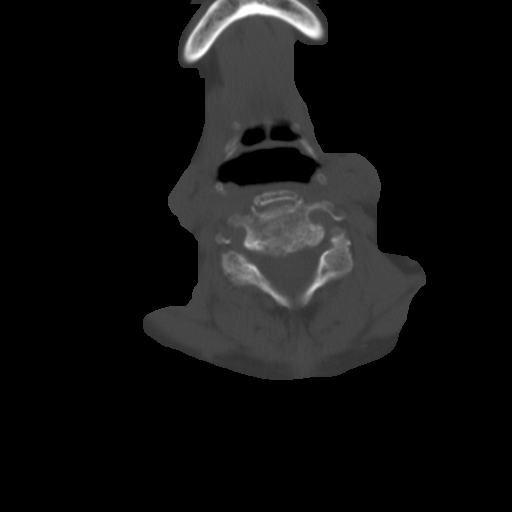
[im 62/103  brain]
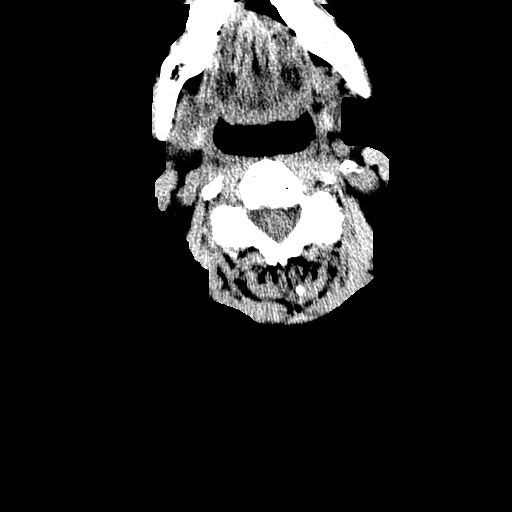
[im 72/103  brain]
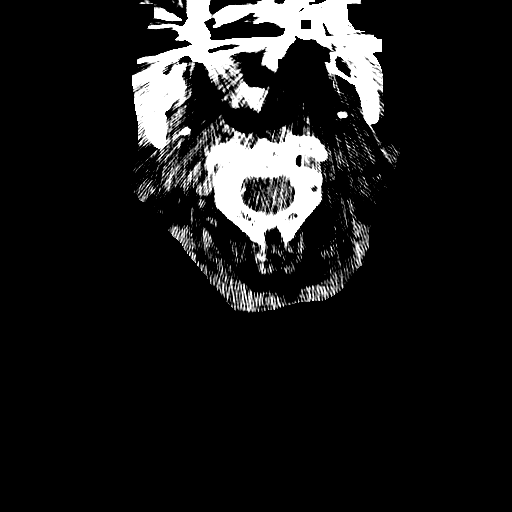
[im 82/103  brain]
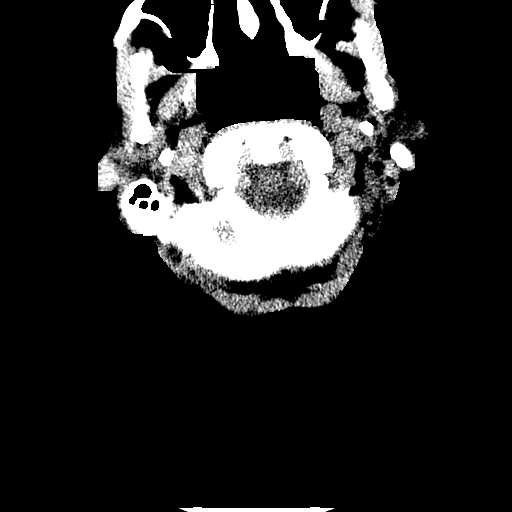
[im 92/103  brain]
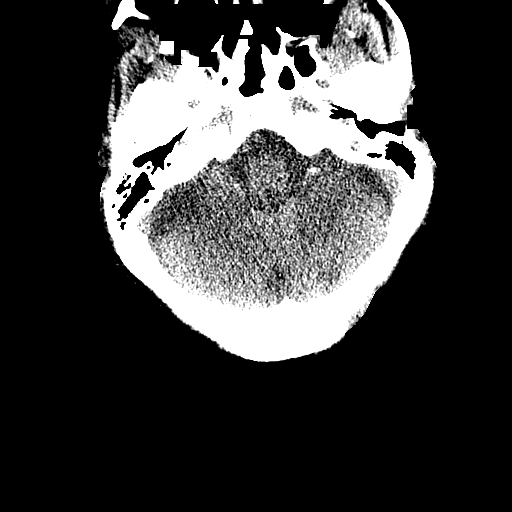
[im 92/103  bone]
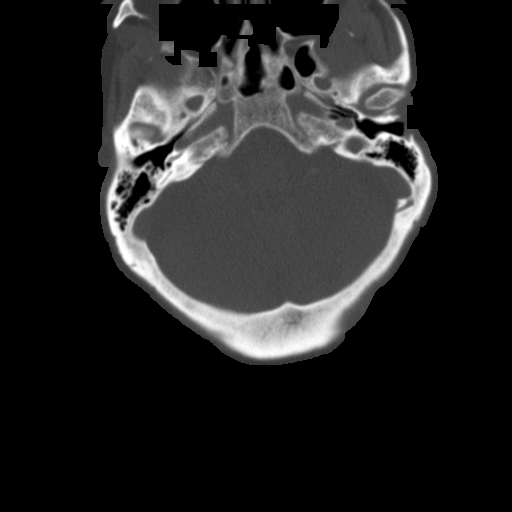

[coronal · coronal · 0.41mm/px · 3 of 40 slices shown]
[im 15/40  brain]
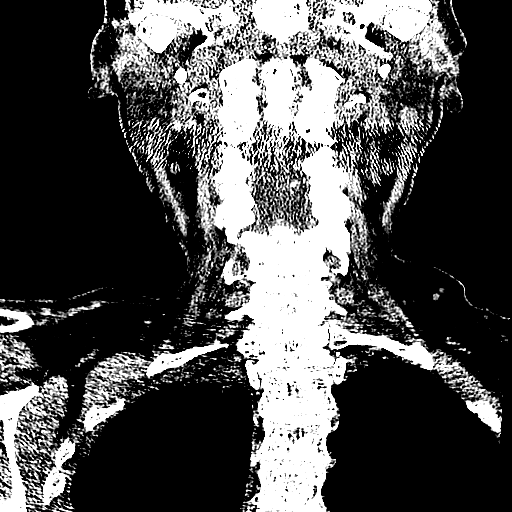
[im 20/40  brain]
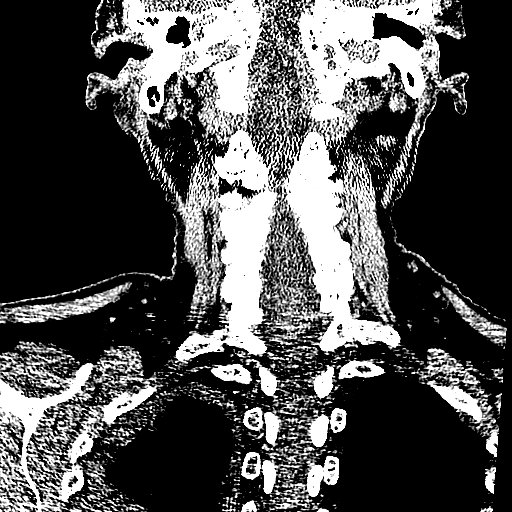
[im 25/40  brain]
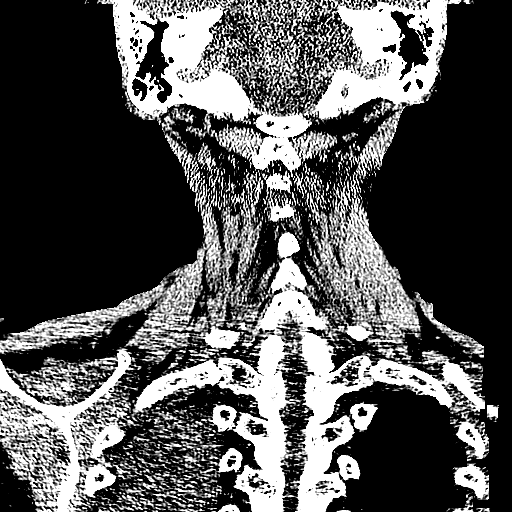

[sagittal · sagittal · 0.41mm/px · 3 of 69 slices shown]
[im 23/69  brain]
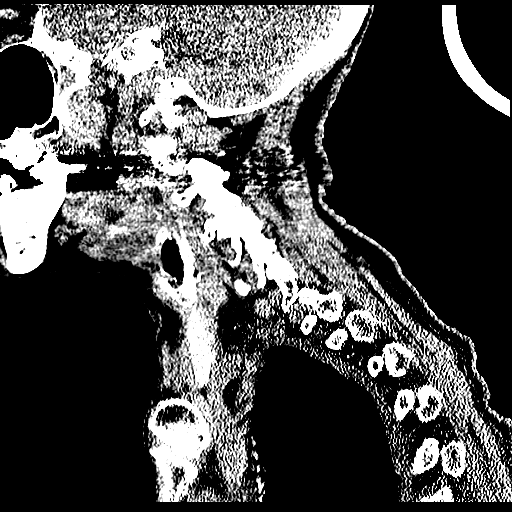
[im 35/69  brain]
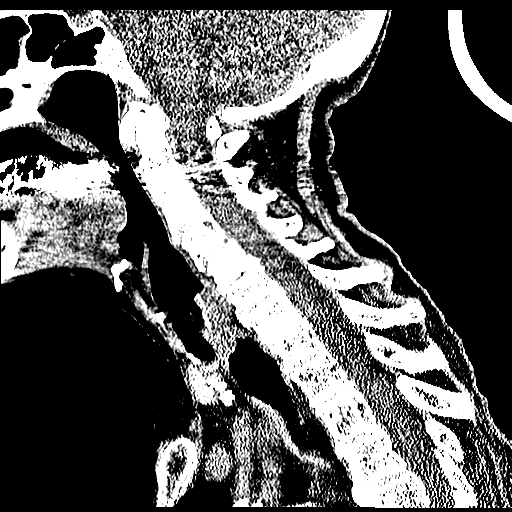
[im 46/69  brain]
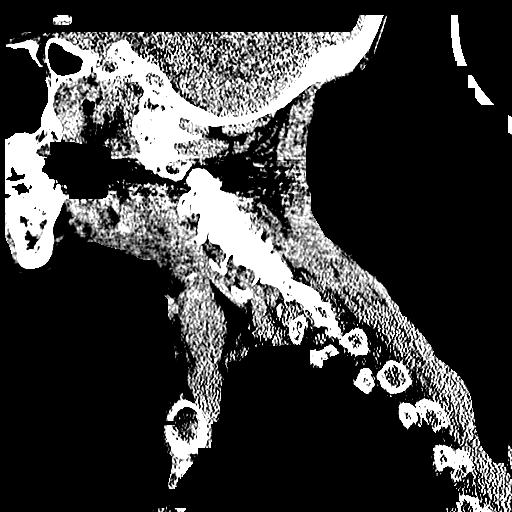

[15 of 47 positions shown; findings below may reference images not displayed]

FINDINGS: CT HEAD FINDINGS

There is moderate soft tissue swelling in the right frontotemporal
scalp. There is a right frontotemporal subdural hematoma measuring
up to 6 mm in thickness. Within the right frontal lobe, there is a 1
cm hematoma in the periventricular white matter, likely representing
shear injury. No other foci of acute intracranial hemorrhage are
identified.

There is stable atrophy with periventricular white matter disease.
No midline shift or hydrocephalus is demonstrated. The calvarium is
intact.

CT MAXILLOFACIAL FINDINGS

There is right periorbital soft tissue swelling. The globes are
intact. There is evidence of scleral banding procedures bilaterally.
There is a small calcification posteriorly in the right globe.

The paranasal sinuses are clear. There is no evidence of acute
facial fracture.

CT CERVICAL SPINE FINDINGS

There is reversal of the usual cervical lordosis with multilevel
spondylosis. No acute fracture or traumatic subluxation is
identified. Several facet joints are fused. No acute soft tissue
findings are demonstrated E neck. Biapical pulmonary parenchymal
scarring is similar to priors. There is a small right pleural
effusion.
IMPRESSION: 1. Right frontotemporal acute subdural hematoma with adjacent small
intraparenchymal hemorrhage in the right frontal periventricular
white matter. No midline shift.
2. Right periorbital and frontal scalp soft tissue swelling.
3. No evidence of acute calvarial, facial or cervical spine
fracture.
4. Critical Value/emergent results were called by telephone at the
time of interpretation on 01/19/2013 at [DATE] to Dr.Vin, who
verbally acknowledged these results.

## 2017-09-24 DEATH — deceased
